# Patient Record
Sex: Male | Born: 1960 | Race: White | Hispanic: No | Marital: Single | Smoking: Current every day smoker
Health system: Southern US, Community
[De-identification: ages and names within clinical notes are randomized; demographics above are authoritative.]

## PROBLEM LIST (undated history)

## (undated) DIAGNOSIS — F200 Paranoid schizophrenia: Secondary | ICD-10-CM

## (undated) DIAGNOSIS — F209 Schizophrenia, unspecified: Secondary | ICD-10-CM

---

## 2006-08-23 ENCOUNTER — Ambulatory Visit: Payer: Self-pay | Admitting: Internal Medicine

## 2006-09-03 ENCOUNTER — Ambulatory Visit: Payer: Self-pay | Admitting: Internal Medicine

## 2006-09-05 ENCOUNTER — Ambulatory Visit (HOSPITAL_COMMUNITY): Admission: RE | Admit: 2006-09-05 | Discharge: 2006-09-05 | Payer: Self-pay | Admitting: Internal Medicine

## 2007-10-30 ENCOUNTER — Ambulatory Visit: Payer: Self-pay | Admitting: *Deleted

## 2007-10-30 ENCOUNTER — Emergency Department (HOSPITAL_COMMUNITY): Admission: EM | Admit: 2007-10-30 | Discharge: 2007-10-31 | Payer: Self-pay | Admitting: Emergency Medicine

## 2007-10-31 ENCOUNTER — Inpatient Hospital Stay (HOSPITAL_COMMUNITY): Admission: RE | Admit: 2007-10-31 | Discharge: 2007-11-07 | Payer: Self-pay | Admitting: *Deleted

## 2008-04-10 ENCOUNTER — Ambulatory Visit: Payer: Self-pay | Admitting: Psychiatry

## 2008-04-10 ENCOUNTER — Emergency Department (HOSPITAL_COMMUNITY): Admission: EM | Admit: 2008-04-10 | Discharge: 2008-04-11 | Payer: Self-pay | Admitting: Emergency Medicine

## 2008-04-11 ENCOUNTER — Inpatient Hospital Stay (HOSPITAL_COMMUNITY): Admission: EM | Admit: 2008-04-11 | Discharge: 2008-04-16 | Payer: Self-pay | Admitting: Psychiatry
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
## 2010-09-19 NOTE — H&P (Signed)
[name] [name], NUZUM NO.:  [id number]   MEDICAL RECORD NO.:  [id number]          PATIENT TYPE:  IPS   LOCATION:  0603                          FACILITY:  BH   PHYSICIAN:  Jasmine Pang, M.D. DATE OF BIRTH:  Jan 03, 1961   DATE OF ADMISSION:  10/31/2007  DATE OF DISCHARGE:                       PSYCHIATRIC ADMISSION ASSESSMENT   This is a voluntary admission to the services of Dr. Milford Cage.   This is a 50 year old divorced white male.  Apparently he fainted on the  street.  A bystander called 911, and he was brought to the emergency  department at [facility].  This was at around 2:30 on June 25.  He  reported that he had been walking since about 8:00 in the morning.  He  had not had any food or fluids since the before, and he was noted to  have a low blood sugar of 69.  His UDS was positive for benzos and  marijuana, and he reported that he was suicidal and hence he was  referred here to the [facility].  He states that his  girlfriend recently broke up with him.  He is having increased  depression due to this breakup.  He also admitted to being suicidal with  a plan to walk out into traffic.  He has been unemployed since August  2008, and he also reported homicidal ideation toward anyone who tries to  stop him from hurting himself.  He reported that he had tried to hurt  himself in the past, most recently by cutting.  This was several years  ago.   PAST PSYCHIATRIC HISTORY:  He reports one prior stay in rehab in 2006.  This was in [place], [place], [place], and this was rehab for cocaine  use.   SOCIAL HISTORY:  He went through the 12th grade.  He has been married  and divorced once.  He has 3 children, a daughter 68, a son 3, a  daughter 28.  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  He is now homeless and he has no income.   FAMILY HISTORY:  His father was an alcoholic.   ALCOHOL AND DRUG HISTORY:  He  does acknowledge smoking marijuana every  now and then.  He has no primary care Suzi Hernan.  He has no psychiatric  care.   MEDICAL PROBLEMS:  None are known.   MEDICATIONS:  None are prescribed.   DRUG ALLERGIES:  No known drug allergies.   As already stated, his UDS was positive for marijuana and benzos.  He  had a low blood sugar.  He had no other remarkable physical findings.   His vital signs on admission to our unit showed he is 65-1/2 inches  tall, weighs 146, temperature 97, blood pressure 119/78 to 114/74, pulse  is 54 to 58 and respirations are 20.  He has several tattoos.  Please  see anatomic drawing for placement.  This was his first syncopal  episode.   MENTAL STATUS EXAM:  Today he is alert and oriented.  He is casually  groomed and dressed.  He appears to be rehydrated and adequately  nourished at this point in time.  His speech has a normal rate, rhythm  and tone although he answers briefly and is not very elaborative in his  answers.  His mood is depressed.  It is appropriate to the situation.  His affect is normal range.  His thought processes are clear, rational  and goal oriented.  He would like to find a job and get back on track.  Judgment and insight are fair.  Concentration and memory are intact.  He  still reports that he is suicidal although he has no active plan.  He is  no longer homicidal and he has no auditory or visual hallucinations.   AXIS I:  Bipolar disorder NOS by his report.  He has an adjustment  disorder due to recent breakup with girlfriend.  AXIS II:  History for abuse in childhood.  AXIS III:  Recent syncope due to lack of food and water.  AXIS IV:  Severe.  He is now homeless.  AXIS V:  35.   The plan is to admit for safety and stabilization.  He was started on  some Depakote for mood stabilization at hour of sleep, Depakote ER 500  mg at h.s. by Dr. Katrinka Blazing, and trazodone 50 mg at h.s., may repeat x1.   The case manager will help with  post discharge plans, including getting  him hooked up with the [facility] for medication  continuation and potentially going back to the shelter.  He has been to  the shelter here in [place] before and is familiar with it.   Estimated length of stay is 3 to 5 days.      Mickie Leonarda Salon, P.A.-C.      Jasmine Pang, M.D.  Electronically Signed    MD/MEDQ  D:  11/01/2007  T:  11/01/2007  Job:  [id number]

## 2010-09-19 NOTE — Discharge Summary (Signed)
[name] [name], [name] [name] NO.:  [id number]   MEDICAL RECORD NO.:  [id number]          PATIENT TYPE:  IPS   LOCATION:  0303                          FACILITY:  BH   PHYSICIAN:  Anselm Jungling, MD  DATE OF BIRTH:  1961/03/15   DATE OF ADMISSION:  04/11/2008  DATE OF DISCHARGE:  04/16/2008                               DISCHARGE SUMMARY   IDENTIFYING DATA/REASON FOR ADMISSION:  This was an inpatient  psychiatric admission for [name] [name], a 50 year old single white male who  was admitted on an involuntary basis after presenting with thoughts of  killing himself or possibly hurting his girlfriend.  He came to [place] with  a history of a psychiatric illness and had been a patient of [name]  at [facility].  However, he had been noncompliant  with his medications for some time.  Please refer to the admission note  for further details pertaining to the symptoms, circumstances and  history that led to his hospitalization.  He was given an initial Axis I  diagnosis of psychosis NOS.  He also had a history of substance abuse  and reported PTSD from early childhood trauma.   MEDICAL/LABORATORY:  The patient was described as having a seizure  disorder at the time of admission, but he was medically cleared in the  emergency department.  His UDS was positive for barbiturates and  marijuana.  He had a slightly elevated white count of 12,100, but this  did not correlate to any apparent illness or infection.  He was  medically and physically assessed by the psychiatric nurse practitioner.  There were no significant medical issues.   HOSPITAL COURSE:  The patient was admitted to the adult inpatient  psychiatric service.  He presented as a disheveled, but adequately  nourished and normally developed adult male who appeared cognitively  quite clouded, depressed and possibly confused.  However, he was  cooperative with treatment and acknowledged substance abuse  issues as  well.  He was agreeable to restarting medication.  He had previously  been treated with a regimen of Seroquel and Depakote.  During this  hospital stay, he was treated with Zyprexa and Depakote with trazodone  as needed for sleep.   The patient participated in various therapeutic activities and groups.  He was a reasonably good participant in the treatment program.  He was  not able to identify any supports for purposes of family conferencing.  He had been living with a girlfriend, but had been kicked out of her  home after coming into with conflict with one of her relatives that had  come to stay there.  As such, he was essentially homeless.   The patient's mood and affect improved, and his thinking cleared over  the next several days.  He worked closely with the case manager towards  an aftercare plan.  He indicated that he wanted to get out of the  [place] area and he was agreeable to a plan to go stay at the shelter  in [place].  He was also open to substance abuse treatment and he  was ultimately referred to a shelter there that had the capability of  providing that service at the same time that he was living there.   AFTERCARE:  As above.  The patient was referred to [facility]  in [place].   DISCHARGE MEDICATIONS:  1. Zyprexa 10 mg q.h.s.  2. Depakote 500 mg q.h.s.  3. Trazodone 100 mg q.h.s.   DISCHARGE INSTRUCTIONS:  He was referred to the [facility] on  [facility] in [place].  He was directed to ask for Sandrea Hughs, who runs the substance abuse program there.  That program is  known as [facility].  He was also referred to the [facility] in  [place].   DISCHARGE DIAGNOSES:  AXIS I:  Polysubstance abuse, not otherwise  specified, early remission.  Schizoaffective disorder, not otherwise  specified.  AXIS II:  Deferred.  AXIS III:  History of seizure disorder.  AXIS IV:  Stressors severe.  AXIS  V:  Global assessment of functioning on discharge 55.      Anselm Jungling, MD  Electronically Signed     SPB/MEDQ  D:  04/16/2008  T:  04/16/2008  Job:  [id number]

## 2010-09-22 NOTE — Discharge Summary (Signed)
NAMEADYEN, [name] NO.:  [id number]   MEDICAL RECORD NO.:  [id number]          PATIENT TYPE:  IPS   LOCATION:  0603                          FACILITY:  BH   PHYSICIAN:  Jasmine Pang, M.D. DATE OF BIRTH:  1960/05/21   DATE OF ADMISSION:  10/31/2007  DATE OF DISCHARGE:  11/07/2007                               DISCHARGE SUMMARY   IDENTIFICATION:  This is a 50 year old divorced white male who was  admitted on a voluntary basis on October 31, 2007.   HISTORY OF PRESENT ILLNESS:  The patient apparently fainted on the  street, a bystander called 911 and he was brought to the emergency  department at [name].  This was around 2:30 on October 30, 2007.  He  reported that he had been walking since about 8 o'clock in the morning.  He had not had any food or fluid since the night before and he was noted  to have a low blood sugar of 69.  His UDS was positive for benzos and  marijuana.  He reported that he was suicidal and hence, he was referred  to the [name].  He states that his girlfriend recently  broke up with him.  He is having increased depression due to the  breakup.  He also admitted to being suicidal with a plan to walk into  traffic.  He has been unemployed since August 2008.  He reported that he  had tried to hurt himself in the past, most recently by cutting.  This  was several years ago.  AM Depakote level on November 03, 2007, was 36.5.  The CBC was within normal limits, and hepatic profile was within normal  limits.   PAST PSYCHIATRIC HISTORY:  The patient reports one prior stay in rehab  in 2006.  [place] [name] [name] [name] and this rehab was  for cocaine use.   FAMILY HISTORY:  The patient's father was alcoholic.   DRUG AND ALCOHOL HISTORY:  The patient does acknowledge smoking  marijuana every now and then.  He has no primary care [name].  He has  no psychiatric care.   MEDICATIONS:  None are prescribed.   MEDICAL  PROBLEMS:  None.   DRUG ALLERGIES:  No known drug allergies.   PHYSICAL EXAM:  There were no acute physical or medical problems noted  on exam.   ADMISSION LABORATORIES:  As already stated, his UDS was positive for  marijuana and benzodiazepines.  His blood sugar was low.  His blood  glucose was low at 69.  He had no other remarkable physical findings.   [name] COURSE:  Upon admission, the patient was started on trazodone  50 mg p.o. q.h.s. 1-2 pills at bedtime if needed.  He was also started  on Depakote ER 500 mg p.o. q.h.s. for mood stabilization.  In individual  sessions with me, the patient was reserved, but cooperative.  He  participated appropriately in unit therapeutic groups and activities.  He discussed the argument with his girlfriend, then he left and tried to  walk to [name] (from  [name]).  He has multiple stressors  including being unemployed and past history of abuse.  He has been in  treatment in the past on lithium carbonate and Prozac.  He is currently  homeless.  He states he stopped drinking 2 years ago.  He has a history  of cocaine abuse in 2005.  He states he gets easily irritated, flies off  the handle and gets rageful.  In addition to the Depakote 500 mg p.o.  q.h.s. on November 02, 2007, he was started on Celexa 20 mg q.a.m.  Trazodone was discontinued because he was not helping with his sleep and  stated he was started on Ambien 10 mg 1-2 p.o. q. h.s.  As  hospitalization progressed, he continued to be somewhat depressed and  anxious with suicidal ideation though he could contract for safety at  the [name], he states he wants to stay in [name] (cannot go back  to [name]).  On November 02, 2007, the patient continued to have  some suicidal ideation.  His sleep was poor with middle of the night  awakening and early morning awakening.  On November 03, 2007, the patient  stated he was thinking of things that happened in his life.  He has been   thinking of the breakup with his girlfriend.  He is worried about where  he will go when he leaves I have been homeless before.  He continued  to be depressed and anxious.  On November 05, 2007, the patient was less  depressed, less anxious with no suicidal ideation.  He was anticipating  discharge on November 07, 2007.  He planned to return to the [name].  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  He was going to go to the [name] for follow up.  His  sleep was good.  He stated he was scared of discharge, but plans to take  it one day at a time.  He discussed alienation from his children and  siblings and his regrets about this.  On November 07, 2007, mental status had  improved markedly from admission status.  The patient was less  depressed, less anxious.  There was no suicidal or homicidal ideation.  No thoughts of self-injurious behavior.  No auditory or visual  hallucinations.  No paranoia or delusions.  Thoughts were logical and  goal-directed.  Thought content, no predominant theme.  Cognitive was  grossly intact.  It was felt the patient was safe for discharge and will  follow up at the [name].   DISCHARGE DIAGNOSES:  Axis I:  Bipolar disorder, not otherwise specified  by his report. Features of posttraumatic stress disorder chronic (from  abuse in childhood).  Axis III:  Recent syncope due to lack of food and water.  Axis IV:  Severe (the patient just broke up with his girlfriend, the  patient is now homeless.  He has financial problems.  He has a burden of  his psychiatric illness).  Axis V: Global assessment of functioning is 50 upon discharge.  GAF was  35 upon admission.  GAF highest past year was 65.   DISCHARGE PLAN:  There was no specific activity level or dietary  restrictions.  The patient was going to return to the [name] to  live.  He was wanting to go to [name] on July 6th at 1  p.m.   DISCHARGE MEDICATIONS:  1. Depakote ER  500 mg at bedtime.  2. Celexa 20 mg daily.  3. Ambien 10 mg at bedtime as needed for sleep.  4. Seroquel 25 mg one every 6 hours if needed for anxiety.      Jasmine Pang, M.D.  Electronically Signed     BHS/MEDQ  D:  11/17/2007  T:  11/18/2007  Job:  [id number]

## 2011-02-01 LAB — VALPROIC ACID LEVEL: Valproic Acid Lvl: 36.5 — ABNORMAL LOW

## 2011-02-01 LAB — HEPATIC FUNCTION PANEL
ALT: 19
Albumin: 3.8
Indirect Bilirubin: 0.6
Total Protein: 6.9

## 2011-02-01 LAB — URINALYSIS, ROUTINE W REFLEX MICROSCOPIC
Bilirubin Urine: NEGATIVE
Glucose, UA: NEGATIVE
Hgb urine dipstick: NEGATIVE
Ketones, ur: >80 — AB
Protein, ur: NEGATIVE

## 2011-02-01 LAB — POCT I-STAT, CHEM 8
Hemoglobin: 15.3
Potassium: 4.8
Sodium: 139
TCO2: 23

## 2011-02-01 LAB — POCT CARDIAC MARKERS
CKMB, poc: <1 — ABNORMAL LOW
Myoglobin, poc: 87
Operator id: 146091

## 2011-02-01 LAB — RAPID URINE DRUG SCREEN, HOSP PERFORMED
Amphetamines: NOT DETECTED
Tetrahydrocannabinol: POSITIVE — AB

## 2011-02-01 LAB — CBC
MCHC: 34.2
RBC: 4.64
WBC: 7.2

## 2011-02-09 LAB — URINALYSIS, ROUTINE W REFLEX MICROSCOPIC
Hgb urine dipstick: NEGATIVE
Nitrite: NEGATIVE
Specific Gravity, Urine: 1.01 (ref 1.005–1.030)
Urobilinogen, UA: 0.2 mg/dL (ref 0.0–1.0)

## 2011-02-09 LAB — CBC
HCT: 47.1 % (ref 39.0–52.0)
Hemoglobin: 16.1 g/dL (ref 13.0–17.0)
MCHC: 34.3 g/dL (ref 30.0–36.0)
MCV: 95.3 fL (ref 78.0–100.0)
RDW: 13.3 % (ref 11.5–15.5)

## 2011-02-09 LAB — DIFFERENTIAL
Basophils Absolute: 0 10*3/uL (ref 0.0–0.1)
Basophils Relative: 0 % (ref 0–1)
Eosinophils Relative: 1 % (ref 0–5)
Monocytes Absolute: 0.5 10*3/uL (ref 0.1–1.0)

## 2011-02-09 LAB — RAPID URINE DRUG SCREEN, HOSP PERFORMED
Cocaine: NOT DETECTED
Opiates: NOT DETECTED
Tetrahydrocannabinol: POSITIVE — AB

## 2011-02-09 LAB — BASIC METABOLIC PANEL
BUN: 10 mg/dL (ref 6–23)
Chloride: 102 mEq/L (ref 96–112)
Glucose, Bld: 95 mg/dL (ref 70–99)
Potassium: 4 mEq/L (ref 3.5–5.1)

## 2013-05-21 ENCOUNTER — Emergency Department (HOSPITAL_COMMUNITY)
Admission: EM | Admit: 2013-05-21 | Discharge: 2013-05-24 | Disposition: A | Discharge status: Other Acute Inpatient Hospital | Payer: Self-pay | Attending: Emergency Medicine | Admitting: Emergency Medicine

## 2013-05-21 ENCOUNTER — Encounter (HOSPITAL_COMMUNITY): Payer: Self-pay | Admitting: Emergency Medicine

## 2013-05-21 DIAGNOSIS — F17200 Nicotine dependence, unspecified, uncomplicated: Secondary | ICD-10-CM | POA: Insufficient documentation

## 2013-05-21 DIAGNOSIS — F411 Generalized anxiety disorder: Secondary | ICD-10-CM | POA: Insufficient documentation

## 2013-05-21 DIAGNOSIS — IMO0002 Reserved for concepts with insufficient information to code with codable children: Secondary | ICD-10-CM | POA: Insufficient documentation

## 2013-05-21 DIAGNOSIS — Z5900 Homelessness unspecified: Secondary | ICD-10-CM | POA: Insufficient documentation

## 2013-05-21 DIAGNOSIS — F200 Paranoid schizophrenia: Secondary | ICD-10-CM

## 2013-05-21 DIAGNOSIS — R45851 Suicidal ideations: Secondary | ICD-10-CM | POA: Insufficient documentation

## 2013-05-21 DIAGNOSIS — R443 Hallucinations, unspecified: Secondary | ICD-10-CM | POA: Insufficient documentation

## 2013-05-21 HISTORY — DX: Paranoid schizophrenia: F20.0

## 2013-05-21 LAB — RAPID URINE DRUG SCREEN, HOSP PERFORMED
AMPHETAMINES: NOT DETECTED
BARBITURATES: NOT DETECTED
BENZODIAZEPINES: NOT DETECTED
Cocaine: NOT DETECTED
Opiates: NOT DETECTED
Tetrahydrocannabinol: POSITIVE — AB

## 2013-05-21 LAB — CBC
HEMATOCRIT: 42.3 % (ref 39.0–52.0)
HEMOGLOBIN: 15.4 g/dL (ref 13.0–17.0)
MCH: 33.6 pg (ref 26.0–34.0)
MCHC: 36.4 g/dL — ABNORMAL HIGH (ref 30.0–36.0)
MCV: 92.2 fL (ref 78.0–100.0)
PLATELETS: 366 10*3/uL (ref 150–400)
RBC: 4.59 MIL/uL (ref 4.22–5.81)
RDW: 13.1 % (ref 11.5–15.5)
WBC: 16.8 10*3/uL — AB (ref 4.0–10.5)

## 2013-05-21 LAB — COMPREHENSIVE METABOLIC PANEL
ALT: 36 U/L (ref 0–53)
AST: 23 U/L (ref 0–37)
Albumin: 4.4 g/dL (ref 3.5–5.2)
Alkaline Phosphatase: 134 U/L — ABNORMAL HIGH (ref 39–117)
BILIRUBIN TOTAL: 0.4 mg/dL (ref 0.3–1.2)
BUN: 16 mg/dL (ref 6–23)
CHLORIDE: 100 meq/L (ref 96–112)
CO2: 20 meq/L (ref 19–32)
CREATININE: 0.85 mg/dL (ref 0.50–1.35)
Calcium: 9.3 mg/dL (ref 8.4–10.5)
GLUCOSE: 87 mg/dL (ref 70–99)
Potassium: 3.8 mEq/L (ref 3.7–5.3)
Sodium: 139 mEq/L (ref 137–147)
Total Protein: 8.2 g/dL (ref 6.0–8.3)

## 2013-05-21 LAB — GLUCOSE, CAPILLARY: Glucose-Capillary: 101 mg/dL — ABNORMAL HIGH (ref 70–99)

## 2013-05-21 LAB — ETHANOL: Alcohol, Ethyl (B): <11 mg/dL (ref 0–11)

## 2013-05-21 LAB — ACETAMINOPHEN LEVEL

## 2013-05-21 LAB — SALICYLATE LEVEL: Salicylate Lvl: <2 mg/dL — ABNORMAL LOW (ref 2.8–20.0)

## 2013-05-21 MED ORDER — LORAZEPAM 1 MG PO TABS
2.0000 mg | ORAL_TABLET | Freq: Once | ORAL | Status: AC
  Administered 2013-05-21: 2 mg via ORAL
  Filled 2013-05-21: qty 2

## 2013-05-21 MED ORDER — NICOTINE 21 MG/24HR TD PT24
21.0000 mg | MEDICATED_PATCH | Freq: Every day | TRANSDERMAL | Status: DC
  Administered 2013-05-22 – 2013-05-23 (×3): 21 mg via TRANSDERMAL
  Filled 2013-05-21 (×3): qty 1

## 2013-05-21 MED ORDER — ONDANSETRON HCL 4 MG PO TABS
4.0000 mg | ORAL_TABLET | Freq: Three times a day (TID) | ORAL | Status: DC | PRN
  Administered 2013-05-21: 4 mg via ORAL
  Filled 2013-05-21: qty 1

## 2013-05-21 MED ORDER — ZOLPIDEM TARTRATE 5 MG PO TABS
5.0000 mg | ORAL_TABLET | Freq: Every evening | ORAL | Status: DC | PRN
  Administered 2013-05-21 – 2013-05-22 (×2): 5 mg via ORAL
  Filled 2013-05-21 (×2): qty 1

## 2013-05-21 MED ORDER — LORAZEPAM 1 MG PO TABS
1.0000 mg | ORAL_TABLET | Freq: Four times a day (QID) | ORAL | Status: DC | PRN
  Administered 2013-05-22 – 2013-05-23 (×4): 1 mg via ORAL
  Filled 2013-05-21 (×4): qty 1

## 2013-05-21 NOTE — ED Notes (Signed)
Called for patient and no response

## 2013-05-21 NOTE — ED Notes (Signed)
Pt states homeless, living on street x 7 days

## 2013-05-21 NOTE — ED Notes (Signed)
Pt states he has been feeling SI last 2 weeks. Hearing voices that tell him to mutilate himself. Pt plans to cut his own throat. CP x 4-5 days, with Nausea, diaphoresis. Lasted 45 minutes Currently CP resolved. Last had CP pain this morning around 9:30 am while make breakfast.

## 2013-05-21 NOTE — ED Notes (Signed)
Personal belongings bagged/labelled stored at locker no. 1/2/3 .

## 2013-05-21 NOTE — ED Notes (Signed)
PT placed in blue paper scrubs and wand ed by security. House coverage notified of need for sitter

## 2013-05-21 NOTE — ED Provider Notes (Addendum)
CSN: [id number]     Arrival date & time 05/21/13  1952 History   None    Chief Complaint  Patient presents with  . Suicidal   (Consider location/radiation/quality/duration/timing/severity/associated sxs/prior Treatment) HPI Comments: Patient states, that he is been hearing voices for the past several years, worse in the last 2, weeks the voices have become more insistent, telling him to hurt himself.  He denies any drug or alcohol abuse.  He has not taken any medication for his psychiatric illness in several years. Within the past, week.  He has become homeless per his choice At this time.  He is very agitated, pacing in the room, but cooperative  The history is provided by the patient.    Past Medical History  Diagnosis Date  . Paranoid schizophrenia    History reviewed. No pertinent past surgical history. History reviewed. No pertinent family history. History  Substance Use Topics  . Smoking status: Current Every Day Smoker -- 1.00 packs/day  . Smokeless tobacco: Never Used  . Alcohol Use: No    Review of Systems  Constitutional: Negative for fever.  Respiratory: Negative for shortness of breath.   Cardiovascular: Negative for chest pain.  Neurological: Negative for headaches.  Psychiatric/Behavioral: Positive for suicidal ideas, hallucinations and self-injury.  All other systems reviewed and are negative.    Allergies  Review of patient's allergies indicates no known allergies.  Home Medications   Current Outpatient Rx  Name  Route  Sig  Dispense  Refill  . benztropine (COGENTIN) 1 MG tablet   Oral   Take 1 tablet (1 mg total) by mouth 2 (two) times daily.   60 tablet   0   . divalproex (DEPAKOTE ER) 250 MG 24 hr tablet   Oral   Take 3 tablets (750 mg total) by mouth at bedtime.   180 tablet   0   . haloperidol (HALDOL) 10 MG tablet   Oral   Take 1 tablet (10 mg total) by mouth 3 (three) times daily. For voices.   90 tablet   0   . traZODone (DESYREL)  100 MG tablet   Oral   Take 2 tablets (200 mg total) by mouth at bedtime.   60 tablet   0    BP 136/88  Pulse 88  Temp(Src) 97.4 F (36.3 C) (Oral)  Resp 18  SpO2 96% Physical Exam  Nursing note and vitals reviewed. Constitutional: He appears well-developed and well-nourished. He appears distressed.  Eyes: Pupils are equal, round, and reactive to light.  Neck: Normal range of motion.  Cardiovascular: Normal rate and regular rhythm.   Pulmonary/Chest: Effort normal and breath sounds normal.  Neurological: He is alert.  Skin:  Is one small lesion on his right forearm, and medial aspect, approximately 1/2 cm round, with a white fluctuant center, minimally painful.  Started today  Psychiatric: His mood appears anxious. He is agitated and actively hallucinating. He expresses impulsivity. He expresses suicidal ideation. He expresses suicidal plans.    ED Course  Procedures (including critical care time) Labs Review Labs Reviewed  CBC - Abnormal; Notable for the following:    WBC 16.8 (*)    MCHC 36.4 (*)    All other components within normal limits  COMPREHENSIVE METABOLIC PANEL - Abnormal; Notable for the following:    Alkaline Phosphatase 134 (*)    All other components within normal limits  SALICYLATE LEVEL - Abnormal; Notable for the following:    Salicylate Lvl <2.0 (*)  All other components within normal limits  URINE RAPID DRUG SCREEN (HOSP PERFORMED) - Abnormal; Notable for the following:    Tetrahydrocannabinol POSITIVE (*)    All other components within normal limits  GLUCOSE, CAPILLARY - Abnormal; Notable for the following:    Glucose-Capillary 101 (*)    All other components within normal limits  ETHANOL  ACETAMINOPHEN LEVEL   Imaging Review No results found.  EKG Interpretation   None       MDM   1. Paranoid schizophrenia, chronic condition     Patient has been evaluated.  Labs have been ordered.  I've asked that the patient be given 2 mg of  Ativan for his agitation.  I will move him to see for psychiatric evaluation    Arman Filter, NP 05/24/13 1955  Arman Filter, NP 06/07/13 [phone]

## 2013-05-22 MED ORDER — PERMETHRIN 0.25 % LIQD
Freq: Once | Status: DC
  Filled 2013-05-22: qty 147.86

## 2013-05-22 MED ORDER — PERMETHRIN 1 % EX LOTN
TOPICAL_LOTION | Freq: Once | CUTANEOUS | Status: AC
  Administered 2013-05-22: 22:00:00 via TOPICAL
  Filled 2013-05-22: qty 59

## 2013-05-22 NOTE — ED Notes (Signed)
Patient has poor eye contact during psych assessment. He holds his head in his hands. He states he has been suicidal for years. States he used to be on medications for the voices but he has been off since 2009 or so. States the medications made the voices worse.

## 2013-05-22 NOTE — Progress Notes (Signed)
MHT initiated inpatient treatment referrals by faxing the following hospitals with bed availability: 1)[facility] 2)[facility] 3)[facility] 4)[facility] [place] 5)[place] 6)[facility] 7)[facility] 10)[facility]  Blain PaisMichelle L Cayleb Jarnigan, MHT/NS
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
## 2013-05-22 NOTE — ED Notes (Signed)
TELEPSYCH-CONFERENCE IN PROGRESS AT PT.'S BEDSIDE .

## 2013-05-23 ENCOUNTER — Encounter (HOSPITAL_COMMUNITY): Payer: Self-pay | Admitting: Emergency Medicine

## 2013-05-23 MED ORDER — DOUBLE ANTIBIOTIC 500-10000 UNIT/GM EX OINT
1.0000 "application " | TOPICAL_OINTMENT | Freq: Two times a day (BID) | CUTANEOUS | Status: DC
  Administered 2013-05-23: 1 via TOPICAL
  Filled 2013-05-23: qty 14.17

## 2013-05-23 MED ORDER — DOUBLE ANTIBIOTIC 500-10000 UNIT/GM EX OINT
1.0000 "application " | TOPICAL_OINTMENT | Freq: Two times a day (BID) | CUTANEOUS | Status: DC
  Filled 2013-05-23 (×17): qty 1

## 2013-05-23 MED ORDER — SULFAMETHOXAZOLE-TMP DS 800-160 MG PO TABS
1.0000 | ORAL_TABLET | Freq: Once | ORAL | Status: AC
  Administered 2013-05-24: 1 via ORAL
  Filled 2013-05-23: qty 1

## 2013-05-23 NOTE — ED Notes (Signed)
[facility] and is going to [id number]. Accepting physician is Maryjean Mornharles Kober.

## 2013-05-23 NOTE — ED Notes (Signed)
Pt's has a 4cm abscess on the anterior, medial portion of his right forearm.

## 2013-05-23 NOTE — ED Notes (Signed)
[place], MD at bedside.  

## 2013-05-23 NOTE — Consult Note (Signed)
  Requested to review pt for admission to 400 Unit-WBC significant and pt had lesions on arm-suspicious for MRSA clinically.Requested culture and antibiotic Rx prior [facility].

## 2013-05-23 NOTE — Progress Notes (Signed)
Danny a LPC at [facility] called to let us know that they were declining pt. due to pt's acuity.  

## 2013-05-23 NOTE — Progress Notes (Signed)
[facility] called stating [facility] added to [facility].  Blain PaisMichelle L Asia Dusenbury, MHT/NS

## 2013-05-24 ENCOUNTER — Inpatient Hospital Stay (HOSPITAL_COMMUNITY)
Admission: AD | Admit: 2013-05-24 | Discharge: 2013-06-01 | DRG: 885 | Disposition: A | Discharge status: Home / Self Care | Payer: No Typology Code available for payment source | Source: Intra-hospital | Attending: Psychiatry | Admitting: Psychiatry

## 2013-05-24 ENCOUNTER — Encounter (HOSPITAL_COMMUNITY): Payer: Self-pay | Admitting: *Deleted

## 2013-05-24 DIAGNOSIS — F319 Bipolar disorder, unspecified: Secondary | ICD-10-CM | POA: Diagnosis present

## 2013-05-24 DIAGNOSIS — F200 Paranoid schizophrenia: Principal | ICD-10-CM | POA: Diagnosis present

## 2013-05-24 DIAGNOSIS — F29 Unspecified psychosis not due to a substance or known physiological condition: Secondary | ICD-10-CM | POA: Diagnosis present

## 2013-05-24 DIAGNOSIS — F411 Generalized anxiety disorder: Secondary | ICD-10-CM | POA: Diagnosis present

## 2013-05-24 DIAGNOSIS — F322 Major depressive disorder, single episode, severe without psychotic features: Secondary | ICD-10-CM | POA: Diagnosis present

## 2013-05-24 DIAGNOSIS — F17200 Nicotine dependence, unspecified, uncomplicated: Secondary | ICD-10-CM | POA: Diagnosis present

## 2013-05-24 DIAGNOSIS — F1210 Cannabis abuse, uncomplicated: Secondary | ICD-10-CM | POA: Diagnosis present

## 2013-05-24 DIAGNOSIS — Z23 Encounter for immunization: Secondary | ICD-10-CM

## 2013-05-24 DIAGNOSIS — B850 Pediculosis due to Pediculus humanus capitis: Secondary | ICD-10-CM | POA: Diagnosis present

## 2013-05-24 DIAGNOSIS — R45851 Suicidal ideations: Secondary | ICD-10-CM

## 2013-05-24 DIAGNOSIS — F4310 Post-traumatic stress disorder, unspecified: Secondary | ICD-10-CM | POA: Diagnosis present

## 2013-05-24 DIAGNOSIS — B356 Tinea cruris: Secondary | ICD-10-CM | POA: Diagnosis present

## 2013-05-24 DIAGNOSIS — F1220 Cannabis dependence, uncomplicated: Secondary | ICD-10-CM

## 2013-05-24 DIAGNOSIS — R45850 Homicidal ideations: Secondary | ICD-10-CM

## 2013-05-24 MED ORDER — ACETAMINOPHEN 325 MG PO TABS: 650.0000 mg | ORAL_TABLET | Freq: Four times a day (QID) | ORAL | Status: DC | PRN

## 2013-05-24 MED ORDER — MUPIROCIN 2 % EX OINT
TOPICAL_OINTMENT | Freq: Three times a day (TID) | CUTANEOUS | Status: DC
  Filled 2013-05-24 (×2): qty 22

## 2013-05-24 MED ORDER — ZIPRASIDONE MESYLATE 20 MG IM SOLR
20.0000 mg | Freq: Once | INTRAMUSCULAR | Status: AC
  Administered 2013-05-24: 20 mg via INTRAMUSCULAR
  Filled 2013-05-24: qty 20

## 2013-05-24 MED ORDER — MAGNESIUM HYDROXIDE 400 MG/5ML PO SUSP: 30.0000 mL | Freq: Every day | ORAL | Status: DC | PRN

## 2013-05-24 MED ORDER — INFLUENZA VAC SPLIT QUAD 0.5 ML IM SUSP
0.5000 mL | INTRAMUSCULAR | Status: AC
  Administered 2013-05-27: 0.5 mL via INTRAMUSCULAR
  Filled 2013-05-24: qty 0.5

## 2013-05-24 MED ORDER — OLANZAPINE 10 MG PO TBDP
10.0000 mg | ORAL_TABLET | Freq: Every day | ORAL | Status: DC
  Administered 2013-05-24 – 2013-05-25 (×2): 10 mg via ORAL
  Filled 2013-05-24 (×3): qty 1

## 2013-05-24 MED ORDER — PNEUMOCOCCAL VAC POLYVALENT 25 MCG/0.5ML IJ INJ
0.5000 mL | INJECTION | INTRAMUSCULAR | Status: AC
  Administered 2013-05-27: 0.5 mL via INTRAMUSCULAR

## 2013-05-24 MED ORDER — ZIPRASIDONE MESYLATE 20 MG IM SOLR
INTRAMUSCULAR | Status: AC
  Administered 2013-05-24: 20 mg via INTRAMUSCULAR
  Filled 2013-05-24: qty 20

## 2013-05-24 MED ORDER — SULFAMETHOXAZOLE-TMP DS 800-160 MG PO TABS
1.0000 | ORAL_TABLET | Freq: Two times a day (BID) | ORAL | Status: DC
  Administered 2013-05-24 – 2013-05-27 (×7): 1 via ORAL
  Filled 2013-05-24 (×11): qty 1

## 2013-05-24 MED ORDER — TRAZODONE HCL 50 MG PO TABS
50.0000 mg | ORAL_TABLET | Freq: Every evening | ORAL | Status: DC | PRN
  Administered 2013-05-24: 50 mg via ORAL
  Filled 2013-05-24: qty 1

## 2013-05-24 MED ORDER — ONDANSETRON HCL 4 MG PO TABS: 4.0000 mg | ORAL_TABLET | Freq: Three times a day (TID) | ORAL | Status: DC | PRN

## 2013-05-24 MED ORDER — LINDANE 1 % EX SHAM
MEDICATED_SHAMPOO | Freq: Once | CUTANEOUS | Status: AC
  Administered 2013-05-24: 17:00:00 via TOPICAL
  Filled 2013-05-24: qty 60

## 2013-05-24 MED ORDER — NICOTINE 21 MG/24HR TD PT24
21.0000 mg | MEDICATED_PATCH | Freq: Every day | TRANSDERMAL | Status: DC
  Filled 2013-05-24: qty 1

## 2013-05-24 MED ORDER — TERBINAFINE HCL 1 % EX CREA
TOPICAL_CREAM | Freq: Two times a day (BID) | CUTANEOUS | Status: DC
  Administered 2013-05-24 – 2013-06-01 (×10): via TOPICAL
  Filled 2013-05-24: qty 12

## 2013-05-24 MED ORDER — NICOTINE 21 MG/24HR TD PT24
21.0000 mg | MEDICATED_PATCH | Freq: Every day | TRANSDERMAL | Status: DC
  Administered 2013-05-25 – 2013-05-29 (×5): 21 mg via TRANSDERMAL
  Filled 2013-05-24 (×12): qty 1

## 2013-05-24 MED ORDER — LORAZEPAM 2 MG/ML IJ SOLN
2.0000 mg | Freq: Once | INTRAMUSCULAR | Status: AC
  Administered 2013-05-24: 2 mg via INTRAMUSCULAR

## 2013-05-24 MED ORDER — DIVALPROEX SODIUM ER 500 MG PO TB24
500.0000 mg | ORAL_TABLET | Freq: Every day | ORAL | Status: DC
  Administered 2013-05-24 – 2013-05-27 (×4): 500 mg via ORAL
  Filled 2013-05-24 (×5): qty 1

## 2013-05-24 MED ORDER — LORAZEPAM 2 MG/ML IJ SOLN
2.0000 mg | Freq: Four times a day (QID) | INTRAMUSCULAR | Status: DC | PRN
  Administered 2013-05-24 – 2013-05-25 (×2): 2 mg via INTRAMUSCULAR
  Filled 2013-05-24 (×2): qty 1

## 2013-05-24 MED ORDER — ZIPRASIDONE MESYLATE 20 MG IM SOLR
20.0000 mg | Freq: Two times a day (BID) | INTRAMUSCULAR | Status: DC | PRN
  Administered 2013-05-24: 20 mg via INTRAMUSCULAR
  Filled 2013-05-24: qty 20

## 2013-05-24 MED ORDER — LORAZEPAM 2 MG/ML IJ SOLN
INTRAMUSCULAR | Status: AC
  Administered 2013-05-24: 2 mg via INTRAMUSCULAR
  Filled 2013-05-24: qty 1

## 2013-05-24 MED ORDER — DOUBLE ANTIBIOTIC 500-10000 UNIT/GM EX OINT
1.0000 "application " | TOPICAL_OINTMENT | Freq: Two times a day (BID) | CUTANEOUS | Status: DC
  Filled 2013-05-24 (×18): qty 1

## 2013-05-24 MED ORDER — TRAZODONE HCL 50 MG PO TABS
50.0000 mg | ORAL_TABLET | Freq: Every evening | ORAL | Status: DC | PRN
  Administered 2013-05-24 – 2013-05-27 (×4): 50 mg via ORAL
  Filled 2013-05-24 (×4): qty 1

## 2013-05-24 MED ORDER — MUPIROCIN 2 % EX OINT
TOPICAL_OINTMENT | Freq: Three times a day (TID) | CUTANEOUS | Status: AC
  Administered 2013-05-24 – 2013-05-26 (×6): via TOPICAL
  Filled 2013-05-24: qty 22

## 2013-05-24 MED ORDER — ALUM & MAG HYDROXIDE-SIMETH 200-200-20 MG/5ML PO SUSP: 30.0000 mL | ORAL | Status: DC | PRN

## 2013-05-24 MED ORDER — PERMETHRIN 0.25 % LIQD
Freq: Once | Status: AC
  Administered 2013-05-24: 23:00:00 via TOPICAL
  Filled 2013-05-24: qty 147.86

## 2013-05-24 MED ORDER — MAGNESIUM HYDROXIDE 400 MG/5ML PO SUSP
30.0000 mL | Freq: Every day | ORAL | Status: DC | PRN
  Administered 2013-05-28: 30 mL via ORAL

## 2013-05-24 MED ORDER — ALUM & MAG HYDROXIDE-SIMETH 200-200-20 MG/5ML PO SUSP
30.0000 mL | ORAL | Status: DC | PRN
  Administered 2013-05-29: 30 mL via ORAL

## 2013-05-24 NOTE — ED Notes (Signed)
[facility]

## 2013-05-24 NOTE — [facility] Group Notes (Signed)
[facility] Group Notes: (Clinical Social Work)   05/24/2013      Type of Therapy:  Group Therapy   Participation Level:  Did Not Attend    Tenoch Mcclure Grossman-Orr, LCSW 05/24/2013, 12:51 PM     

## 2013-05-24 NOTE — Tx Team (Signed)
Initial Interdisciplinary Treatment Plan  PATIENT STRENGTHS: (choose at least two) Average or above average intelligence Communication skills General fund of knowledge Motivation for treatment/growth Physical Health  PATIENT STRESSORS: Financial difficulties Loss of housing Medication change or noncompliance No support   PROBLEM LIST: Problem List/Patient Goals Date to be addressed Date deferred Reason deferred Estimated date of resolution  Command AH 05/24/13     SI 05/24/13     Potential for aggression 05/24/13                                          DISCHARGE CRITERIA:  Adequate post-discharge living arrangements Improved stabilization in mood, thinking, and/or behavior Need for constant or close observation no longer present Reduction of life-threatening or endangering symptoms to within safe limits Verbal commitment to aftercare and medication compliance  PRELIMINARY DISCHARGE PLAN: Outpatient therapy Placement in alternative living arrangements  PATIENT/FAMIILY INVOLVEMENT: This treatment plan has been presented to and reviewed with the patient, [name] [name] L Seeling, and/or family member.  The patient and family have been given the opportunity to ask questions and make suggestions.  Merian CapronFriedman, Amaury Kuzel [facility] 05/24/2013, 4:11 AM

## 2013-05-24 NOTE — Progress Notes (Signed)
Psychoeducational Group Note  Date:  05/24/2013 Time: 8:00 p.m.   Group Topic/Focus:  Wrap-Up Group:   The focus of this group is to help patients review their daily goal of treatment and discuss progress on daily workbooks.  Participation Level: Did Not Attend  Participation Quality:  Not Applicable  Affect:  Not Applicable  Cognitive:  Not Applicable  Insight:  Not Applicable  Engagement in Group: Not Applicable  Additional Comments:  The patient did not attend group this evening due to being placed on isolation precautions.   Hazle CocaGOODMAN, Rebecca Cairns S 05/24/2013, 9:56 PM

## 2013-05-24 NOTE — Consult Note (Signed)
  [facility].After admission he was found to have an additional abscess behind the rt ear and a rash in groin area.Also it was discovered he had had a treatment for head lice on 1/16.     Pt states sores began to appear 1 week ago.Rash in groin and thigh area is new to him.He says he was treated and given a nit comb for head lice  O-Hair is nearly matted with lice.Behind the rt ear is a 1cm abscess with typical appearance of MRSA lesion. The rt forearm has a raised 2 cm abscess with satellite lesions more superficial 0.5-1.0 cm in size.The groin area has typical appearance of tinea in intertrigenous area with what appears to be "id" reaction on medial thighs  A- Head Lice failed treatment x 1      Probable MRSA      Tinea Cruris  P- Culture abscess      Septra DS x 10 days      Bactroban TID with dressing change x 5 days      Monitor forearm abscess for possible need for I&D      Kwell shampoo treatment      Keep abscesses covered at all times      Repeat CBC 48 hrs with diff

## 2013-05-24 NOTE — Progress Notes (Signed)
Patient ID: [name] [name], male   DOB: [birth year], 53 y.o.   MRN: [id number] D. Orders in place for MRSA culture of wound. Swabs unavailable at this time (as not stocked on unit ) , Clinical research associatewriter phoned [name] at lab for swabs and stated would be sent at 1900 with next lab draw. Discussed above information with L. Earlene Plateravis NP as patient with scheduled antiobiotics ordered without results pending. Per NP, hold medications until able to obtain culture. Currently pt resting quietly and in no acute distress at this time. Discussed with Dr. Lynnae SandhoffAkthar and L. Padron NP, and patient would benefit from decreased stimulation to rest, as patient arrived late in the night and was up until 0400.  Will continue to monitor q 15 minutes for safety.
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
## 2013-05-24 NOTE — Progress Notes (Signed)
Patient ID: [name] [name], male   DOB: [birth year], 53 y.o.   MRN: [id number] D. Patient agitated pacing in room , hitting call bell. Patient would not respond verbally when staff approached but he did agree he was hearing voices. A. Pt offered prn medication and initially hesitant but did accept geodon 20mg  IM and ativan 2 mg IM. Low stimulation provided as patient clearly easily agitated and volatile. R. Patient with effective response to meds given. Will continue to monitor q 15 minutes for safety.

## 2013-05-24 NOTE — Progress Notes (Signed)
Patient ID: [name] [name] L Thai, male   DOB: [birth year], 53 y.o.   MRN: [id number] Per report from prior shift, patient has been very agitated, labile, responding to internal stimuli and threatening towards staff, Pt unable to follow directions at this time. Received report from previous RN that patient with suspected MRSA infection to wounds on arm and behind right ear. Pt also with Lice and matted hair. Discussed above information with L. Earlene Plateravis NP, Julieanne Cottonina AC RN , and Dr. Lynnae SandhoffAkthar as patient is unable at this time to comply with contact precautions and is acutely psychotic. No orders received. Pt currently in no acute distress at this time. Will continue to monitor q 15 minutes for safety.

## 2013-05-24 NOTE — [facility] Counselor (Signed)
LCSW unable to complete PSA as patient sleeping due to Geodon IM.  Patient has reportedly been irritable unable to sleep for extended period of time until injection. [name] Bernatherine C Nevaeh Korte, LCSW

## 2013-05-24 NOTE — Progress Notes (Signed)
Patient ID: London SheerFleming L Virtue, male   DOB: 29-Mar-1961, 53 y.o.   MRN: [id number] Psychoeducational Group Note  Date:  05/24/2013 Time:  1000am  Group Topic/Focus:  Making Healthy Choices:   The focus of this group is to help patients identify negative/unhealthy choices they were using prior to admission and identify positive/healthier coping strategies to replace them upon discharge.  Participation Level:  Did Not Attend  Participation Quality:    Affect Cognitive Insight: Engagement in Group:Additional Comments:  Inventory and Psychoeducational group- healthy support systems.   Valente DavidWeaver, Mazelle Huebert Brooks 05/24/2013,10:19 AM
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
## 2013-05-24 NOTE — ED Notes (Signed)
[facility] called to transport pt 

## 2013-05-24 NOTE — Progress Notes (Signed)
Pt vol admitted via [facility] for the treatment of depression, command AH to SI, as well as pt's worry that he may harm someone. Pt has hx of paranoid schizophrenia but stopped taking meds 2 years ago. Recently living in boarding house but left due to his increasing anxiety, depression and worry he would harm others. States he has times where he is so irritated he blacks out and fights. Pt now homeless. Pt arrived in paper scrubs with undressed abscess resembling that of a MRSA lesion on his R forearm. Area is raised approx 2cm, is red and painful. During skin search, additional lesion discovered behind the R ear with bloody drainage. Pt's hair also noted to be matted with lice. Pt remarked he had been treated in the ED for lice approx 24 hours prior however no mention was made during report from ED RN. Also noted was rash in and around groin area consistent with a fungal infection. Pt oriented to unit, meal provided and fall risk precautions put in place as pt reports more than 1 fall within the last 6 months. He denies VH and contracts for safety at this time. No medical hx or allergies. UDS +THC however pt states he has been clean (related to his past hx of other drug use) x 4 years and hasn't had alcohol in over 20 years.   Once on unit, pt was assisted with completing hygiene and clothes washed. PA called to unit, assessed pt's skin. At approximately 0345, pt became quite agitated and was observed pacing in his room. Pt on call bell stating, "I'm about to explode." Pt had been given trazadone for sleep at 0315 when wounds were dressed however it was ineffective and pt increasingly upset.  PA paged and emergent orders received to admin geodon 20mg  and ativan 2mg  both IM which pt allowed without incident. On reassess, pt is lying down however is still awake. Orders have been placed for treatment of wounds and lice which will need to take place once patient has had some rest and mood is more stable. Pt safe at this  time. Will continue to monitor closely. Lawrence MarseillesFriedman, Emmaline Wahba Eakes

## 2013-05-25 DIAGNOSIS — F411 Generalized anxiety disorder: Secondary | ICD-10-CM

## 2013-05-25 DIAGNOSIS — F29 Unspecified psychosis not due to a substance or known physiological condition: Secondary | ICD-10-CM

## 2013-05-25 DIAGNOSIS — F200 Paranoid schizophrenia: Principal | ICD-10-CM

## 2013-05-25 DIAGNOSIS — F332 Major depressive disorder, recurrent severe without psychotic features: Secondary | ICD-10-CM

## 2013-05-25 NOTE — Progress Notes (Signed)
[facility] MD Progress Note  05/25/2013 9:51 AM [name] [name]  MRN:  [id number] [facility]  Patient ordered a fine tooth comb to remove the rest of the lice eggs.  He has been treated twice, linens and room cleaned.  His depression is a 10/10 with suicidal ideations and he contracts for safety.  His sleep is "alright", appetite "good".  He denies hallucinations at this time. Diagnosis:   DSM5: Schizophrenia Disorders:  Schizophrenia (295.7)  Depressive Disorders:  Major Depressive Disorder - Severe (296.23)  Axis I: Anxiety Disorder NOS, Chronic Paranoid Schizophrenia and Major Depression, Recurrent severe Axis II: Deferred Axis III:  Past Medical History  Diagnosis Date  . Paranoid schizophrenia    Axis IV: other psychosocial or environmental problems, problems related to social environment and problems with primary support group Axis V: 41-50 serious symptoms  ADL's:  Intact  Sleep: Fair  Appetite:  Good  Suicidal Ideation:  Plan:  overdose Intent:  none Means:  none Homicidal Ideation:  Denies   Psychiatric Specialty Exam: Review of Systems  Constitutional: Negative.   HENT: Negative.   Eyes: Negative.   Respiratory: Negative.   Cardiovascular: Negative.   Gastrointestinal: Negative.   Genitourinary: Negative.   Musculoskeletal: Negative.   Skin: Negative.   Neurological: Negative.   Endo/Heme/Allergies: Negative.   Psychiatric/Behavioral: Positive for depression, suicidal ideas and hallucinations.    Blood pressure 153/76, pulse 93, temperature 97.5 F (36.4 C), temperature source Oral, resp. rate 18, height 5\' 6"  (1.676 m), weight 72.122 kg (159 lb).Body mass index is 25.68 kg/(m^2).  General Appearance: Casual  Eye Contact::  Minimal  Speech:  Normal Rate  Volume:  Decreased  Mood:  Depressed  Affect:  Congruent  Thought Process:  Coherent  Orientation:  Full (Time, Place, and Person)  Thought Content:  WDL  Suicidal Thoughts:  Yes.  without intent/plan   Homicidal Thoughts:  No  Memory:  Immediate;   Fair Recent;   Fair Remote;   Fair  Judgement:  Poor  Insight:  Lacking  Psychomotor Activity:  Decreased  Concentration:  Fair  Recall:  Fair  Akathisia:  No  Handed:  Right  AIMS (if indicated):     Assets:  Leisure Time Physical Health Resilience  Sleep:  Number of Hours: 6.75   Current Medications: Current Facility-Administered Medications  Medication Dose Route Frequency Provider Last Rate Last Dose  . acetaminophen (TYLENOL) tablet 650 mg  650 mg Oral Q6H PRN Court Joyharles E Kober, PA-C      . alum & mag hydroxide-simeth (MAALOX/MYLANTA) 200-200-20 MG/5ML suspension 30 mL  30 mL Oral Q4H PRN Court Joyharles E Kober, PA-C      . divalproex (DEPAKOTE ER) 24 hr tablet 500 mg  500 mg Oral QHS Fransisca KaufmannLaura Volner, NP   500 mg at 05/24/13 2110  . influenza vac split quadrivalent PF (FLUARIX) injection 0.5 mL  0.5 mL Intramuscular Tomorrow-1000 Aakash Hollomon      . LORazepam (ATIVAN) injection 2 mg  2 mg Intramuscular Q6H PRN Thresa RossNadeem Akhtar, MD   2 mg at 05/24/13 1715  . magnesium hydroxide (MILK OF MAGNESIA) suspension 30 mL  30 mL Oral Daily PRN Court Joyharles E Kober, PA-C      . mupirocin ointment (BACTROBAN) 2 %   Topical TID Court Joyharles E Kober, PA-C      . nicotine (NICODERM CQ - dosed in mg/24 hours) patch 21 mg  21 mg Transdermal Daily Court Joyharles E Kober, PA-C   21 mg at 05/25/13 0820  . OLANZapine  zydis (ZYPREXA) disintegrating tablet 10 mg  10 mg Oral QHS Fransisca Kaufmann, NP   10 mg at 05/24/13 2110  . ondansetron (ZOFRAN) tablet 4 mg  4 mg Oral Q8H PRN Court Joy, PA-C      . pneumococcal 23 valent vaccine (PNU-IMMUNE) injection 0.5 mL  0.5 mL Intramuscular Tomorrow-1000 Erven Ramson      . sulfamethoxazole-trimethoprim (BACTRIM DS) 800-160 MG per tablet 1 tablet  1 tablet Oral Q12H Court Joy, PA-C   1 tablet at 05/25/13 [id number]  . terbinafine (LAMISIL) 1 % cream   Topical BID Court Joy, PA-C      . traZODone (DESYREL) tablet 50 mg  50 mg Oral  QHS PRN,MR X 1 Court Joy, PA-C   50 mg at 05/24/13 2157  . ziprasidone (GEODON) injection 20 mg  20 mg Intramuscular Q12H PRN Thresa Ross, MD   20 mg at 05/24/13 1715    Lab Results: No results found for this or any previous visit (from the past 48 hour(s)).  Physical Findings: AIMS: Facial and Oral Movements Muscles of Facial Expression: None, normal Lips and Perioral Area: None, normal Jaw: None, normal Tongue: None, normal,Extremity Movements Upper (arms, wrists, hands, fingers): None, normal Lower (legs, knees, ankles, toes): None, normal, Trunk Movements Neck, shoulders, hips: None, normal, Overall Severity Severity of abnormal movements (highest score from questions above): None, normal Incapacitation due to abnormal movements: None, normal Patient's awareness of abnormal movements (rate only patient's report): No Awareness, Dental Status Current problems with teeth and/or dentures?: Yes (poor hygiene) Does patient usually wear dentures?: No  CIWA:    COWS:     Treatment Plan Summary: Daily contact with patient to assess and evaluate symptoms and progress in treatment Medication management  Plan:  Review of chart, vital signs, medications, and notes. 1-Individual and group therapy 2-Medication management for depression and anxiety:  Medications reviewed with the patient and he stated no negative side effects, no changes made 3-Coping skills for depression, anxiety, and psychosis 4-Continue crisis stabilization and management 5-Address health issues--monitoring vital signs, stable 6-Treatment plan in progress to prevent relapse of depression, psychosis, and anxiety  Medical Decision Making Problem Points:  Established problem, stable/improving (1) and Review of psycho-social stressors (1) Data Points:  Review of medication regiment & side effects (2)  I certify that inpatient services furnished can reasonably be expected to improve the patient's condition.    Nanine Means, PMH-NP 05/25/2013, 9:51 AM  I agreed with findings and treatment plan of this patient Thedore Mins, MD

## 2013-05-25 NOTE — [facility] Group Notes (Signed)
[facility] LCSW Group Therapy  05/25/2013 1:15 pm  Type of Therapy: Process Group Therapy  Participation Level:  Did not attend    Summary of Progress/Problems: Today's group addressed the issue of overcoming obstacles.  Patients were asked to identify their biggest obstacle post d/c that stands in the way of their on-going success, and then problem solve as to how to manage this.  Daryel Geraldorth, Adesuwa Osgood B 05/25/2013   4:25 PM

## 2013-05-25 NOTE — Progress Notes (Addendum)
Pt has been resting in room this evening, awakened for meds. He is flat and depressed. States the geodon and ativan he received earlier have helped with the voices. He endorses passive SI but verbally contracts for safety. Denies VH/HI. Patient actively scratching at nape of neck. Right forearm dressing changed as well as bactroban applied to draining wound behind R ear. Left open to air as patient is in his room at all times and wound is in his scalp/hair making it difficult for adhesive to stick. Pt afebrile and denies pain at wound sites. Pt supported, educated about infection prevention. Strongly encouraged to wash hands frequently and avoid scratching if at all possible.Communal unit laundry room treated with [employer] linen solution per NP order. Pt's stored belongings in search room locker bagged in biohazard and locker disinfected. Also reminded pt of fall precautions as pt reports hx of blacking out with falls. Pt currently sleeping and staff has maintained low stimulation this evening due to patient's propensity to quickly agitate and escalate. Pt safe.

## 2013-05-25 NOTE — Progress Notes (Signed)
D: Pt presents with flat affect and depressed mood. Pt denies AVH. Pt reports SI and contracts for safety at this time. Pt report feeling depressed today. Pt agitated and pacing in room. Pt given IM ativan for agitation. Writer assessed pt hair for lice. No active bugs noted. Writer noted nits in pt head (posterior).  Writer ordered fine tooth comb from central supply. Pt instructed that he will need to change linen daily and comb out nits TID. Writer will continue to assess pt progress to treatment. A: Medications given as ordered per MD. Verbal support given. 15 minute checks performed for safety. R: Pt safety maintained.

## 2013-05-25 NOTE — [facility] Group Notes (Signed)
[facility]   05/25/2013 12:17 PM  Participation Quality:  Did not attend due to isolation precautions due to head lice    Kiribatiorth, Baldo DaubRodney B

## 2013-05-26 DIAGNOSIS — F319 Bipolar disorder, unspecified: Secondary | ICD-10-CM | POA: Diagnosis present

## 2013-05-26 LAB — CBC WITH DIFFERENTIAL/PLATELET
Basophils Absolute: 0.1 10*3/uL (ref 0.0–0.1)
Basophils Relative: 1 % (ref 0–1)
EOS ABS: 0.6 10*3/uL (ref 0.0–0.7)
Eosinophils Relative: 8 % — ABNORMAL HIGH (ref 0–5)
HCT: 37.7 % — ABNORMAL LOW (ref 39.0–52.0)
Hemoglobin: 13.1 g/dL (ref 13.0–17.0)
Lymphocytes Relative: 37 % (ref 12–46)
Lymphs Abs: 2.6 10*3/uL (ref 0.7–4.0)
MCH: 31.7 pg (ref 26.0–34.0)
MCHC: 34.7 g/dL (ref 30.0–36.0)
MCV: 91.3 fL (ref 78.0–100.0)
Monocytes Absolute: 0.6 10*3/uL (ref 0.1–1.0)
Monocytes Relative: 8 % (ref 3–12)
NEUTROS PCT: 46 % (ref 43–77)
Neutro Abs: 3.1 10*3/uL (ref 1.7–7.7)
PLATELETS: 330 10*3/uL (ref 150–400)
RBC: 4.13 MIL/uL — ABNORMAL LOW (ref 4.22–5.81)
RDW: 12.8 % (ref 11.5–15.5)
WBC: 6.9 10*3/uL (ref 4.0–10.5)

## 2013-05-26 MED ORDER — OLANZAPINE 10 MG PO TBDP
10.0000 mg | ORAL_TABLET | Freq: Three times a day (TID) | ORAL | Status: DC | PRN
Start: 2013-05-26 — End: 2013-06-01
  Administered 2013-05-26 – 2013-06-01 (×4): 10 mg via ORAL
  Filled 2013-05-26 (×5): qty 1

## 2013-05-26 MED ORDER — HALOPERIDOL 5 MG PO TABS
5.0000 mg | ORAL_TABLET | Freq: Every day | ORAL | Status: DC
  Administered 2013-05-26: 5 mg via ORAL
  Filled 2013-05-26 (×2): qty 1

## 2013-05-26 NOTE — [facility] Suicide Risk Assessment (Signed)
[facility] INPATIENT:  Family/Significant Other Suicide Prevention Education  Suicide Prevention Education:  Patient Refusal for Family/Significant Other Suicide Prevention Education: The patient [name] [name] has refused to provide written consent for family/significant other to be provided Family/Significant Other Suicide Prevention Education during admission and/or prior to discharge.  Physician notified.  Daryel Geraldorth, Jessey Huyett B 05/26/2013, 12:51 PM

## 2013-05-26 NOTE — [facility] Group Notes (Signed)
[facility] Group Notes:  (Nursing/MHT/Case Management/Adjunct)  Date:  05/26/2013  Time: 900 am  Type of Therapy:  Nurse Education  Participation Level:  Did Not Attend  Participation Quality:  n/a  Affect:  n/a  Cognitive:  n/a  Insight:  None  Engagement in Group:  n/a  Modes of Intervention:  n/a  Summary of Progress/Problems:  [name] [name] L 05/26/2013, 11:34 AM

## 2013-05-26 NOTE — Progress Notes (Signed)
D: Pt reports active SI. Pt verbally contracts for safety at this time. Pt agitated this morning and stated that he do not want to go back to the shelter. Pt presents with flat affect and depressed mood. Pt reports feeling depressed 10/10 d/t his situation, not having a place to go. Pt denies feeling HI at this time. No AVH verbalized by pt. Pt taken off of contact precaution. Writer spoke with Vernona RiegerLaura in Infection Control and she stated that pt should be isolated for 24 hours after effective treatment. No active bugs or nits noted in pt hair. Writer along with housekeeping sprayed the pt room and furniture with Nix spray and bleach. Medications administered as ordered pt MD. Verbal support given. Pt encouraged to attend groups. 15 minute checks performed for safety. R: Pt safety maintained.

## 2013-05-26 NOTE — Progress Notes (Addendum)
Patient ID: [name] [name], male   DOB: 08/27/1960, 53 y.o.   MRN: [id number] [facility] MD Progress Note  05/26/2013 10:21 AM [name] [name]  MRN:  [id number] Subjective: "I have trouble controlling my anger and temper.'' Objective:  Patient reports ongoing mood lability, psychosis and frustrations. He reports hearing voices telling to hurt himself but says he feels safe in the hospital. He states that he is frustrated about being homeless with no money. He has tried to contact his relatives but unable to locate them. He states that his girlfriend who was helping him with money just pack her stuffs and left few days ago. He reports feeling hopeless, depressed, worthless and disillusion. He is complaint with his current medications and has not reported any adverse reactions.. Diagnosis:   DSM5: Schizophrenia Disorders:  Schizophrenia (295.7)  Depressive Disorders:  Major Depressive Disorder - Severe (296.23)  Axis I: Chronic Paranoid Schizophrenia.            Bipolar disorder-unspecified. Axis II: Deferred Axis III:  Past Medical History  Diagnosis Date  . Head Lice    Axis IV: other psychosocial or environmental problems, problems related to social environment and problems with primary support group Axis V: 41-50 serious symptoms  ADL's:  Intact  Sleep: Fair  Appetite:  Good  Suicidal Ideation: yes Plan:  overdose Intent:  none Means:  none Homicidal Ideation:  Denies   Psychiatric Specialty Exam: Review of Systems  Constitutional: Negative.   HENT: Negative.   Eyes: Negative.   Respiratory: Negative.   Cardiovascular: Negative.   Gastrointestinal: Negative.   Genitourinary: Negative.   Musculoskeletal: Negative.   Skin: Negative.   Neurological: Negative.   Endo/Heme/Allergies: Negative.   Psychiatric/Behavioral: Positive for depression, suicidal ideas and hallucinations.    Blood pressure 153/76, pulse 93, temperature 97.5 F (36.4 C), temperature source Oral, resp.  rate 18, height 5\' 6"  (1.676 m), weight 72.122 kg (159 lb).Body mass index is 25.68 kg/(m^2).  General Appearance: poorly groomed, dishelved  Patent attorney::  Minimal  Speech:  Normal Rate  Volume:  Decreased  Mood:  Depressed  Affect:  Congruent  Thought Process:  Coherent  Orientation:  Full (Time, Place, and Person)  Thought Content: psychostic  Suicidal Thoughts:  Yes.  without intent/plan  Homicidal Thoughts:  No  Memory:  Immediate;   Fair Recent;   Fair Remote;   Fair  Judgement:  Poor  Insight:  Lacking  Psychomotor Activity:  Decreased  Concentration:  Fair  Recall:  Fair  Akathisia:  No  Handed:  Right  AIMS (if indicated):     Assets:  Leisure Time Physical Health Resilience  Sleep:  Number of Hours: 6   Current Medications: Current Facility-Administered Medications  Medication Dose Route Frequency Provider Last Rate Last Dose  . acetaminophen (TYLENOL) tablet 650 mg  650 mg Oral Q6H PRN Court Joy, PA-C      . alum & mag hydroxide-simeth (MAALOX/MYLANTA) 200-200-20 MG/5ML suspension 30 mL  30 mL Oral Q4H PRN Court Joy, PA-C      . divalproex (DEPAKOTE ER) 24 hr tablet 500 mg  500 mg Oral QHS Fransisca Kaufmann, NP   500 mg at 05/25/13 2103  . haloperidol (HALDOL) tablet 5 mg  5 mg Oral QHS Tyra Gural      . influenza vac split quadrivalent PF (FLUARIX) injection 0.5 mL  0.5 mL Intramuscular Tomorrow-1000 Collyn Selk      . magnesium hydroxide (MILK OF MAGNESIA) suspension 30 mL  30 mL Oral Daily PRN Court Joyharles E Kober, PA-C      . mupirocin ointment (BACTROBAN) 2 %   Topical TID Court Joyharles E Kober, PA-C      . nicotine (NICODERM CQ - dosed in mg/24 hours) patch 21 mg  21 mg Transdermal Daily Court Joyharles E Kober, PA-C   21 mg at 05/25/13 0820  . OLANZapine zydis (ZYPREXA) disintegrating tablet 10 mg  10 mg Oral Q8H PRN Hershell Brandl      . ondansetron (ZOFRAN) tablet 4 mg  4 mg Oral Q8H PRN Court Joyharles E Kober, PA-C      . pneumococcal 23 valent vaccine (PNU-IMMUNE)  injection 0.5 mL  0.5 mL Intramuscular Tomorrow-1000 Morna Flud      . sulfamethoxazole-trimethoprim (BACTRIM DS) 800-160 MG per tablet 1 tablet  1 tablet Oral Q12H Court Joyharles E Kober, PA-C   1 tablet at 05/26/13 0757  . terbinafine (LAMISIL) 1 % cream   Topical BID Court Joyharles E Kober, PA-C      . traZODone (DESYREL) tablet 50 mg  50 mg Oral QHS PRN,MR X 1 Court Joyharles E Kober, PA-C   50 mg at 05/25/13 2103  . ziprasidone (GEODON) injection 20 mg  20 mg Intramuscular Q12H PRN Thresa RossNadeem Akhtar, MD   20 mg at 05/24/13 1715    Lab Results:  Results for orders placed during the hospital encounter of 05/24/13 (from the past 48 hour(s))  CBC WITH DIFFERENTIAL     Status: Abnormal   Collection Time    05/26/13  6:25 AM      Result Value Range   WBC 6.9  4.0 - 10.5 K/uL   RBC 4.13 (*) 4.22 - 5.81 MIL/uL   Hemoglobin 13.1  13.0 - 17.0 g/dL   HCT 40.937.7 (*) 81.139.0 - 91.452.0 %   MCV 91.3  78.0 - 100.0 fL   MCH 31.7  26.0 - 34.0 pg   MCHC 34.7  30.0 - 36.0 g/dL   RDW 78.212.8  95.611.5 - 21.315.5 %   Platelets 330  150 - 400 K/uL   Neutrophils Relative % 46  43 - 77 %   Neutro Abs 3.1  1.7 - 7.7 K/uL   Lymphocytes Relative 37  12 - 46 %   Lymphs Abs 2.6  0.7 - 4.0 K/uL   Monocytes Relative 8  3 - 12 %   Monocytes Absolute 0.6  0.1 - 1.0 K/uL   Eosinophils Relative 8 (*) 0 - 5 %   Eosinophils Absolute 0.6  0.0 - 0.7 K/uL   Basophils Relative 1  0 - 1 %   Basophils Absolute 0.1  0.0 - 0.1 K/uL   Comment[facility]    Physical Findings: AIMS: Facial and Oral Movements Muscles of Facial Expression: None, normal Lips and Perioral Area: None, normal Jaw: None, normal Tongue: None, normal,Extremity Movements Upper (arms, wrists, hands, fingers): None, normal Lower (legs, knees, ankles, toes): None, normal, Trunk Movements Neck, shoulders, hips: None, normal, Overall Severity Severity of abnormal movements (highest score from questions above): None, normal Incapacitation due to  abnormal movements: None, normal Patient's awareness of abnormal movements (rate only patient's report): No Awareness, Dental Status Current problems with teeth and/or dentures?: No Does patient usually wear dentures?: No  CIWA:    COWS:     Treatment Plan Summary: Daily contact with patient to assess and evaluate symptoms and progress in treatment Medication management  Plan:  Review of chart, vital signs, medications, and notes. 1-Individual and group  therapy 2-Medication management : Continue Depakote 500mg  po BID for Bipolar, Haldol 5mg  po Qhs for Psychosis and Trazodone 50mg  po Qhs as needed for Insomnia. 3-Coping skills for depression, anxiety, and psychosis 4-Continue crisis stabilization and management 5-Address health issues--monitoring vital signs, stable 6-Treatment plan in progress to prevent relapse of depression, psychosis, and anxiety 7- Patient received treatment for head lice. Medical Decision Making Problem Points:  Established problem, improving (1) and Review of psycho-social stressors (1) Data Points:  Review of medication regiment & side effects (2)  I certify that inpatient services furnished can reasonably be expected to improve the patient's condition.   Thedore Mins, MD 05/26/2013, 10:21 AM
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
## 2013-05-26 NOTE — Tx Team (Signed)
  Interdisciplinary Treatment Plan Update   Date Reviewed:  05/26/2013  Time Reviewed:  8:22 AM  Progress in Treatment:   Attending groups: Yes Participating in groups: Yes Taking medication as prescribed: Yes  Tolerating medication: Yes Family/Significant other contact made: No Patient understands diagnosis: Yes AEB asking for help with anger and agitation Discussing patient identified problems/goals with staff: Yes  See initial care plan Medical problems stabilized or resolved: Yes Denies suicidal/homicidal ideation: No  But contracts for safety Patient has not harmed self or others: Yes  For review of initial/current patient goals, please see plan of care.  Estimated Length of Stay:  4-5 days  Reason for Continuation of Hospitalization: Depression Medication stabilization Other; describe agitation, anger  New Problems/Goals identified:  N/A  Discharge Plan or Barriers:   return to the streets of [place], follow up outpt  Additional Comments:  [name] [name] is a 53 year old male who [facility] complaining of feeling suicidal for the last two weeks. Patient moved out of his boarding house to live on the streets for fear his feelings of agitation might lead him to hurt someone. In the ED he reported hearing voices telling him to kill himself by cutting his throat. Patient reported recent stressor that his girlfriend left him one week ago. Patient has been sleeping most of the day due to receiving IM geodon this morning due to acute agitation. Nursing staff reported that he was very agitated, labile, threatening staff and responding to internal stimuli. Upon waking up this afternoon the patient is much more pleasant and able to speak with this Clinical research associatewriter. Patient states "I wanted to kill myself. I am tired of everything. I have been hearing voices. Been off medications since 2009. I have no support. Just been wandering around in the cold trying to survive."   Attendees:   Signature: Thedore MinsMojeed Akintayo, MD 05/26/2013 8:22 AM   Signature: Richelle Itood Keymoni Mccaster, LCSW 05/26/2013 8:22 AM  Signature: Fransisca KaufmannLaura Jr, NP 05/26/2013 8:22 AM  Signature: Joslyn Devonaroline Beaudry, RN 05/26/2013 8:22 AM  Signature: Liborio NixonPatrice White, RN 05/26/2013 8:22 AM  Signature:  05/26/2013 8:22 AM  Signature:   05/26/2013 8:22 AM  Signature:    Signature:    Signature:    Signature:    Signature:    Signature:      Scribe for Treatment Team:   Richelle Itood Ali Mclaurin, LCSW  05/26/2013 8:22 AM

## 2013-05-26 NOTE — [facility] Group Notes (Signed)
[facility] LCSW Group Therapy  05/26/2013 , 12:35 PM   Type of Therapy:  Group Therapy  Participation Level:  Minimal  Participation Quality:  Attentive  Affect:  Appropriate  Cognitive:  Alert  Insight:  Improving  Engagement in Therapy:  Minimal  Modes of Intervention:  Discussion, Exploration and Socialization  Summary of Progress/Problems: Today's group focused on the term Diagnosis.  Participants were asked to define the term, and then pronounce whether it is a negative, positive or neutral term.  This was the first group [name] [name] was allowed to attend.  He slept throughout.  [name], [name] B 05/26/2013 , 12:35 PM

## 2013-05-26 NOTE — Progress Notes (Signed)
Psychoeducational Group Note  Date:  05/26/2013 Time:  8:00 p.m.   Group Topic/Focus:  Wrap-Up Group:   The focus of this group is to help patients review their daily goal of treatment and discuss progress on daily workbooks.  Participation Level: Did Not Attend  Participation Quality:  Not Applicable  Affect:  Not Applicable  Cognitive:  Not Applicable  Insight:  Not Applicable  Engagement in Group: Not Applicable  Additional Comments:  The patient did not attend group this evening since he was resting in his room.   Ranelle Auker S 05/26/2013, 10:06 PM

## 2013-05-26 NOTE — Progress Notes (Signed)
D:  Pt endorses SI but contracts for safety. Pt denies HI/VH. Pt is pleasant and cooperative. Pt just feels depressed about current situation and the events that led up to his admission. Pt really worried about where he is going after he leaves [facility]. Pt did spend a lot of time in the dayroom tonight, and was observed playing cards and watching TV.   A: Pt was offered support and encouragement. Pt was given scheduled medications. Pt was encourage to attend groups. Q 15 minute checks were done for safety.   R: Pt is taking medication. Pt receptive to treatment and safety maintained on unit.

## 2013-05-26 NOTE — ED Provider Notes (Signed)
Medical screening examination/treatment/procedure(s) were performed by non-physician practitioner and as supervising physician I was immediately available for consultation/collaboration.  EKG Interpretation   None         Suzi RootsKevin E Lanice Folden, MD 05/26/13 [phone]

## 2013-05-26 NOTE — Progress Notes (Signed)
Patient ID: [name] [name], male   DOB: 11-07-1960, 53 y.o.   MRN: [id number] D: Pt. Pacing back and forth in room, agitated " could have stayed in the fu--ing hospital had I know this" "I can't watch TV, just in the room". A: Writer introduced self to client and encouraged him to remain calm. Writer gave client snacks, medication and drink. R: Writer seemed appeased with the attending he was getting. Staff will monitor q4015min for safety. Pt. Is safe on the unit.

## 2013-05-27 ENCOUNTER — Ambulatory Visit (HOSPITAL_COMMUNITY)
Admit: 2013-05-27 | Discharge: 2013-05-27 | Disposition: A | Discharge status: Home / Self Care | Payer: No Typology Code available for payment source | Attending: Psychiatry | Admitting: Psychiatry

## 2013-05-27 LAB — WOUND CULTURE
CULTURE: NO GROWTH
GRAM STAIN: NONE SEEN

## 2013-05-27 MED ORDER — HALOPERIDOL 5 MG PO TABS
5.0000 mg | ORAL_TABLET | Freq: Two times a day (BID) | ORAL | Status: DC
  Administered 2013-05-27 – 2013-05-29 (×4): 5 mg via ORAL
  Filled 2013-05-27 (×8): qty 1

## 2013-05-27 MED ORDER — NAPROXEN 375 MG PO TABS
375.0000 mg | ORAL_TABLET | Freq: Two times a day (BID) | ORAL | Status: DC
  Administered 2013-05-27 – 2013-05-31 (×9): 375 mg via ORAL
  Filled 2013-05-27 (×13): qty 1

## 2013-05-27 MED ORDER — TRAZODONE HCL 150 MG PO TABS
150.0000 mg | ORAL_TABLET | Freq: Every day | ORAL | Status: DC
  Administered 2013-05-27: 150 mg via ORAL
  Filled 2013-05-27 (×2): qty 1

## 2013-05-27 NOTE — Progress Notes (Signed)
Patient ID: [name] [name], male   DOB: [birth year], 53 y.o.   MRN: [id number] [facility] MD Progress Note  05/27/2013 10:00 AM [name] [name]  MRN:  [id number] Subjective: Patient states "I am feeling depressed, angry, and hopeless. I was up pacing during the night feeling so agitated. I did not tell anyone but I've been hitting my hand against the wall. The voices tell me to hurt myself. I have constant suicidal thoughts but no way of killing myself."  Objective:  Patient reports ongoing symptoms of depression, psychosis, and agitation. He is experiencing auditory hallucinations telling him to kill himself. The patient reports constant SI with plan to overdose. He is rating his symptoms of depression and anxiety at ten. The patient appears severely depressed reporting that his girlfriend breaking up with him caused his symptoms to intensify. [name] [name] reports that he has pacing in his room to cope with his symptoms. He tells this Clinical research associate that he has been hitting his left hand on the wall. Upon examination his left hand appears mildly swollen with decreased ROM compared to his right hand.   Diagnosis:   DSM5: Schizophrenia Disorders:  Schizophrenia (295.7)  Depressive Disorders:  Major Depressive Disorder - Severe (296.23)  Axis I: Chronic Paranoid Schizophrenia.            Bipolar disorder-unspecified. Axis II: Deferred Axis III:  Past Medical History  Diagnosis Date  . Head Lice    Axis IV: other psychosocial or environmental problems, problems related to social environment and problems with primary support group Axis V: 41-50 serious symptoms  ADL's:  Intact  Sleep: Fair  Appetite:  Good  Suicidal Ideation: yes Plan:  overdose Intent:  none Means:  none Homicidal Ideation:  Denies   Psychiatric Specialty Exam: Review of Systems  Constitutional: Negative.   HENT: Negative.   Eyes: Negative.   Respiratory: Negative.   Cardiovascular: Negative.   Gastrointestinal: Negative.    Genitourinary: Negative.   Musculoskeletal: Positive for joint pain (Patient reports mild pain to left hand. ).  Skin: Negative.   Neurological: Negative.   Endo/Heme/Allergies: Negative.   Psychiatric/Behavioral: Positive for depression, suicidal ideas and hallucinations. Negative for memory loss and substance abuse. The patient is nervous/anxious and has insomnia.     Blood pressure 153/76, pulse 93, temperature 97.5 F (36.4 C), temperature source Oral, resp. rate 18, height 5\' 6"  (1.676 m), weight 72.122 kg (159 lb).Body mass index is 25.68 kg/(m^2).  General Appearance: Fairly groomed   Patent attorney::  Minimal  Speech:  Normal Rate  Volume:  Decreased  Mood:  Depressed  Affect:  Congruent  Thought Process:  Coherent  Orientation:  Full (Time, Place, and Person)  Thought Content: psychostic  Suicidal Thoughts:  Yes.  with intent/plan  Homicidal Thoughts:  No  Memory:  Immediate;   Fair Recent;   Fair Remote;   Fair  Judgement:  Poor  Insight:  Lacking  Psychomotor Activity:  Decreased  Concentration:  Fair  Recall:  Fair  Akathisia:  No  Handed:  Right  AIMS (if indicated):     Assets:  Leisure Time Physical Health Resilience  Sleep:  Number of Hours: 5.5   Current Medications: Current Facility-Administered Medications  Medication Dose Route Frequency Provider Last Rate Last Dose  . acetaminophen (TYLENOL) tablet 650 mg  650 mg Oral Q6H PRN Court Joy, PA-C      . alum & mag hydroxide-simeth (MAALOX/MYLANTA) 200-200-20 MG/5ML suspension 30 mL  30 mL Oral Q4H PRN Leonette Most  Peggye Fothergill, PA-C      . divalproex (DEPAKOTE ER) 24 hr tablet 500 mg  500 mg Oral QHS Fransisca Kaufmann, NP   500 mg at 05/26/13 2113  . haloperidol (HALDOL) tablet 5 mg  5 mg Oral QHS Quinci Gavidia   5 mg at 05/26/13 2113  . influenza vac split quadrivalent PF (FLUARIX) injection 0.5 mL  0.5 mL Intramuscular Tomorrow-1000 Jesaiah Fabiano      . magnesium hydroxide (MILK OF MAGNESIA) suspension 30 mL  30  mL Oral Daily PRN Court Joy, PA-C      . mupirocin ointment (BACTROBAN) 2 %   Topical TID Court Joy, PA-C      . nicotine (NICODERM CQ - dosed in mg/24 hours) patch 21 mg  21 mg Transdermal Daily Court Joy, PA-C   21 mg at 05/26/13 0800  . OLANZapine zydis (ZYPREXA) disintegrating tablet 10 mg  10 mg Oral Q8H PRN Blain Hunsucker   10 mg at 05/27/13 0756  . ondansetron (ZOFRAN) tablet 4 mg  4 mg Oral Q8H PRN Court Joy, PA-C      . pneumococcal 23 valent vaccine (PNU-IMMUNE) injection 0.5 mL  0.5 mL Intramuscular Tomorrow-1000 Shaquera Ansley      . sulfamethoxazole-trimethoprim (BACTRIM DS) 800-160 MG per tablet 1 tablet  1 tablet Oral Q12H Court Joy, PA-C   1 tablet at 05/27/13 0754  . terbinafine (LAMISIL) 1 % cream   Topical BID Court Joy, PA-C      . traZODone (DESYREL) tablet 50 mg  50 mg Oral QHS PRN,MR X 1 Court Joy, PA-C   50 mg at 05/27/13 0022  . ziprasidone (GEODON) injection 20 mg  20 mg Intramuscular Q12H PRN Thresa Ross, MD   20 mg at 05/24/13 1715    Lab Results:  Results for orders placed during the hospital encounter of 05/24/13 (from the past 48 hour(s))  CBC WITH DIFFERENTIAL     Status: Abnormal   Collection Time    05/26/13  6:25 AM      Result Value Range   WBC 6.9  4.0 - 10.5 K/uL   RBC 4.13 (*) 4.22 - 5.81 MIL/uL   Hemoglobin 13.1  13.0 - 17.0 g/dL   HCT 16.1 (*) 09.6 - 04.5 %   MCV 91.3  78.0 - 100.0 fL   MCH 31.7  26.0 - 34.0 pg   MCHC 34.7  30.0 - 36.0 g/dL   RDW 40.9  81.1 - 91.4 %   Platelets 330  150 - 400 K/uL   Neutrophils Relative % 46  43 - 77 %   Neutro Abs 3.1  1.7 - 7.7 K/uL   Lymphocytes Relative 37  12 - 46 %   Lymphs Abs 2.6  0.7 - 4.0 K/uL   Monocytes Relative 8  3 - 12 %   Monocytes Absolute 0.6  0.1 - 1.0 K/uL   Eosinophils Relative 8 (*) 0 - 5 %   Eosinophils Absolute 0.6  0.0 - 0.7 K/uL   Basophils Relative 1  0 - 1 %   Basophils Absolute 0.1  0.0 - 0.1 K/uL   Comment: Performed at [facility]    Physical Findings: AIMS: Facial and Oral Movements Muscles of Facial Expression: None, normal Lips and Perioral Area: None, normal Jaw: None, normal Tongue: None, normal,Extremity Movements Upper (arms, wrists, hands, fingers): None, normal Lower (legs, knees, ankles, toes): None, normal, Trunk Movements Neck, shoulders, hips: None,  normal, Overall Severity Severity of abnormal movements (highest score from questions above): None, normal Incapacitation due to abnormal movements: None, normal Patient's awareness of abnormal movements (rate only patient's report): No Awareness, Dental Status Current problems with teeth and/or dentures?: No Does patient usually wear dentures?: No  CIWA:    COWS:     Treatment Plan Summary: Daily contact with patient to assess and evaluate symptoms and progress in treatment Medication management  Plan:  Review of chart, vital signs, medications, and notes. 1-Individual and group therapy 2-Medication management : Continue Depakote 500mg  po BID for Bipolar, Increase Haldol to 5mg  po BID for Psychosis, Increase Trazodone to 150mg  po Qhs for complaint of insomnia.  3-Coping skills for depression, anxiety, and psychosis 4-Continue crisis stabilization and management 5-Address health issues--Order x-ray of left hand to rule out acute trauma. Start Naproxen 375 mg BID for inflammation of left hand, start ice to left hand to decrease swelling. D/C Bactrim as the patient's wound culture was negative for MRSA.  6-Treatment plan in progress to prevent relapse of depression, psychosis, and anxiety 7- Patient received treatment for head lice and has been cleared by ID to be taken off contact precautions.   Medical Decision Making Problem Points:  Established problem, worsening (3) and Review of psycho-social stressors (1), New problem with workup (1)  Data Points:  Review or order clinical lab tests (1) Review of medication regiment & side  effects (2) Review of new medications or change in dosage (2)  I certify that inpatient services furnished can reasonably be expected to improve the patient's condition.   Fransisca KaufmannDAVIS, LAURA, NP-C 05/27/2013, 10:00 AM   I agreed with findings and treatment plan of this patient Thedore MinsMojeed Dael Howland, MD

## 2013-05-27 NOTE — Progress Notes (Signed)
D: Pt presents anxious this morning. Pt agitated d/t auditory hallucinations telling him to kill himself and that he is worthless. Pt endorses active SI. Pt contracts for safety at this time. Pt given prn Zyprexa to decrease agitation. Pt observed pacing hallway this morning and appeared worried. Pt reports feeling depressed 10/10. Pt compliant with taking meds and attending groups today. A: Medications administered as ordered per MD. Verbal support given. Pt encouraged to attend groups. 15 minute checks performed for safety. R: Pt safety maintained.

## 2013-05-27 NOTE — [facility] Group Notes (Signed)
[facility]   05/27/2013 9:36 AM  Participation Quality:  Minimal   Mood/Affect:  Flat  Depression Rating:  8  Anxiety Rating:  8  Thoughts of Suicide:  No Will you contract for safety?   NA  Current AVH:  No  Plan for Discharge/Comments:  [name] [name] presented as depressed, however better mood than previously.  He stated that he did not sleep well.  The medication did not help with his sleep.  CSW informed him that he will have to talk to the doctor or nurse practitioner about his sleeping issues and adjusting his medications.  He indicated that his appetite is good.    Transportation Means: unknown  Supports:  unknown  Simona HuhYang, Tina

## 2013-05-27 NOTE — [facility] Group Notes (Signed)
[facility] [employer] Group Therapy  05/27/2013 , 2:44 PM    Type of Therapy:  [employer] Presentation  Participation Level:  Did not attend    Summary of Progress/Problems:  Onalee HuaDavid from [employer] came to present his recovery story and play the guitar.    Daryel Geraldorth, Abriella Filkins B 05/27/2013 , 2:44 PM

## 2013-05-27 NOTE — Progress Notes (Signed)
Patient ID: [name] [name], male   DOB: [birth year], 53 y.o.   MRN: [id number] D: Pt. In room, reports hearing voices to hurt himself "they want me out". Pt. Is able to contract for safety. A: Writer provided emotional support encouraged client to report to staff if voices become unbearable and he cannot contract for safety. Writer encouraged group. Staff will monitor q2515min for safety. R: pt. Is safe on the unit. Pt. Attended group.

## 2013-05-28 MED ORDER — DIVALPROEX SODIUM ER 500 MG PO TB24
750.0000 mg | ORAL_TABLET | Freq: Every day | ORAL | Status: DC
  Administered 2013-05-28 – 2013-05-31 (×4): 750 mg via ORAL
  Filled 2013-05-28 (×6): qty 1

## 2013-05-28 MED ORDER — MAGNESIUM CITRATE PO SOLN
1.0000 | Freq: Once | ORAL | Status: AC
  Administered 2013-05-28: 1 via ORAL

## 2013-05-28 MED ORDER — TRAZODONE HCL 100 MG PO TABS
200.0000 mg | ORAL_TABLET | Freq: Every day | ORAL | Status: DC
  Administered 2013-05-28 – 2013-05-31 (×4): 200 mg via ORAL
  Filled 2013-05-28 (×5): qty 2
  Filled 2013-05-28: qty 28

## 2013-05-28 NOTE — [facility] Group Notes (Signed)
[facility] Group Notes:  (Counselor/Nursing/MHT/Case Management/Adjunct)  05/28/2013 1:15PM  Type of Therapy:  Group Therapy  Participation Level:  Active  Participation Quality:  Appropriate  Affect:  Flat  Cognitive:  Oriented  Insight:  Improving  Engagement in Group:  Limited  Engagement in Therapy:  Limited  Modes of Intervention:  Discussion, Exploration and Socialization  Summary of Progress/Problems: The topic for group was balance in life.  Pt participated in the discussion about when their life was in balance and out of balance and how this feels.  Pt discussed ways to get back in balance and short term goals they can work on to get where they want to be. [name] [name] was a reluctant participant initially.  He became more activated as the conversation turned to handling conflict with others.  He was insistent that it is good to walk away from situations in which you may be tempted to do the wrong thing, but if faced with threatening situations, it is important to meet force with force.  Despite others pointing out that can potentially lead one to prison, he stuck with his guns and was not dissuaded.   [name] [name], [name] [name] 05/28/2013 1:29 PM

## 2013-05-28 NOTE — Progress Notes (Signed)
Morning Wellness  The focus of this group is to educate the patient on the purpose and policies of crisis stabilization and provide a format to answer questions about their admission. The group details unit policies and expectations of patients while admitted.   Spoke with patient individually. Patient has no concerns of stay here at this time. Patient states that his "medications need more time to work." Patient was referred to case manager for medication concerns.

## 2013-05-28 NOTE — Progress Notes (Signed)
Adult Psychoeducational Group Note  Date:  05/28/2013 Time:  9:18 PM  Group Topic/Focus:  Wrap-Up Group:   The focus of this group is to help patients review their daily goal of treatment and discuss progress on daily workbooks.  Participation Level:  Active  Participation Quality:  Appropriate  Affect:  Appropriate  Cognitive:  Alert  Insight: Appropriate  Engagement in Group:  Engaged  Modes of Intervention:  Discussion  Additional Comments:  Pt stated that he is having a good day and that he may be discharged tomorrow. Pt also stated that he plans to stay on his medications.  Kaleen OdeaCOOKE, Amiya Escamilla R 05/28/2013, 9:18 PM

## 2013-05-28 NOTE — Progress Notes (Signed)
D: Patient denies HI and visual hallucinations. The patient is positive for auditory hallucinations and is having passive SI. The patient has a depressed mood and affect. The patient reports that his medication are "starting to work" but states that he "needs more time here." The patient is attending groups and interacting appropriately within the milieu.  A: Patient given emotional support from RN. Patient encouraged to come to staff with concerns and/or questions. Patient's medication routine continued. Patient's orders and plan of care reviewed. Patient referred to MD and case manager for discharge concerns.  R: Patient remains cooperative. Will continue to monitor patient q15 minutes for safety.

## 2013-05-28 NOTE — Progress Notes (Signed)
Patient ID: [name] [name], male   DOB: 1961-04-22, 53 y.o.   MRN: [id number] D: pt. Reports concerns about when he'll be discharged and where he will be going. "I guess I'll go back to the shelter" Pt. Reports CM gave him some places to call for discharge follow up.  Pt. Reports voices have decreased and contracts for safety. Pt. Is visible on the unit, in the hall, showered and in dayroom.  A: Writer provided emotional support encouraged him to follow up on discharge plan given to him by CM. Pt. Encouraged to attend group. Staff will monitor q4315min for safety.  R: Pt. Is safe on the unit.

## 2013-05-28 NOTE — Tx Team (Signed)
  Interdisciplinary Treatment Plan Update   Date Reviewed:  05/28/2013  Time Reviewed:  3:08 PM  Progress in Treatment:   Attending groups: Yes Participating in groups: Yes Taking medication as prescribed: Yes  Tolerating medication: Yes Family/Significant other contact made: Yes  Patient understands diagnosis: Yes  Discussing patient identified problems/goals with staff: Yes Medical problems stabilized or resolved: Yes Denies suicidal/homicidal ideation: No, but contracts for safety Patient has not harmed self or others: Yes  For review of initial/current patient goals, please see plan of care.  Estimated Length of Stay:  4-5 days  Reason for Continuation of Hospitalization: Depression Hallucinations Medication stabilization Suicidal ideation  New Problems/Goals identified:  N/A  Discharge Plan or Barriers:   return to the streets of [place], follow up outpt  Additional Comments:  "I had difficulty sleeping last night and I am still hearing voices.''feeling depressed, angry, and hopeless. I was up pacing during the night feeling so agitated. I did not tell anyone but I've been hitting my hand against the wall. The voices tell me to hurt myself. I have constant suicidal thoughts but no way of killing myself."  Patient reports decreased depressive symptoms, agitation but still feeling irritable. He continues to report command auditory hallucinations-hearing voices telling him to hurt himself. He is reporting difficulty sleeping, feeling hopeless, worthless, excessive worries and recurrent suicidal thoughts with plan to overdose. However, he thinks his current medications are working but would like at least a little adjustment   Attendees:  Signature: Thedore MinsMojeed Akintayo, MD 05/28/2013 3:08 PM   Signature: Richelle Itood Ariahna Smiddy, LCSW 05/28/2013 3:08 PM  Signature: Fransisca KaufmannLaura Huster, NP 05/28/2013 3:08 PM  Signature: Joslyn Devonaroline Beaudry, RN 05/28/2013 3:08 PM  Signature: Liborio NixonPatrice White, RN 05/28/2013 3:08 PM   Signature:  05/28/2013 3:08 PM  Signature:   05/28/2013 3:08 PM  Signature:    Signature:    Signature:    Signature:    Signature:    Signature:      Scribe for Treatment Team:   Richelle Itood Marcos Peloso, LCSW  05/28/2013 3:08 PM

## 2013-05-28 NOTE — Progress Notes (Signed)
Patient ID: [name] [name], male   DOB: Sep 07, 1960, 53 y.o.   MRN: [id number] [facility] MD Progress Note  05/28/2013 11:03 AM [name] [name]  MRN:  [id number] Subjective: Patient states "I had difficulty sleeping last night and I am still hearing voices.''feeling depressed, angry, and hopeless. I was up pacing during the night feeling so agitated. I did not tell anyone but I've been hitting my hand against the wall. The voices tell me to hurt myself. I have constant suicidal thoughts but no way of killing myself."  Objective:  Patient reports decreased depressive symptoms, agitation but still feeling irritable. He continues to report command auditory hallucinations-hearing voices telling him to hurt himself. He is reporting difficulty sleeping, feeling hopeless, worthless, excessive worries and recurrent suicidal thoughts with plan to overdose. However, he thinks his current medications are working but would like at least a little adjustment. X-ray reports of patient's swollen right hand indicates no fracture.  Diagnosis:   DSM5: Schizophrenia Disorders:  Schizophrenia (295.7)  Depressive Disorders:  Major Depressive Disorder - Severe (296.23), Mood disorder  Axis I: Chronic Paranoid Schizophrenia.            Bipolar disorder-unspecified. Axis II: Deferred Axis III:  Past Medical History  Diagnosis Date  . Head Lice    Axis IV: other psychosocial or environmental problems, problems related to social environment and problems with primary support group Axis V: 41-50 serious symptoms  ADL's:  Intact  Sleep: Fair  Appetite:  Good  Suicidal Ideation: yes Plan:  overdose Intent:  none Means:  none Homicidal Ideation:  Denies   Psychiatric Specialty Exam: Review of Systems  Constitutional: Negative.   HENT: Negative.   Eyes: Negative.   Respiratory: Negative.   Cardiovascular: Negative.   Gastrointestinal: Negative.   Genitourinary: Negative.   Musculoskeletal: Positive for joint  pain (Patient reports mild pain to left hand. ).  Skin: Negative.   Neurological: Negative.   Endo/Heme/Allergies: Negative.   Psychiatric/Behavioral: Positive for depression, suicidal ideas and hallucinations. Negative for memory loss and substance abuse. The patient is nervous/anxious and has insomnia.     Blood pressure 96/62, pulse 75, temperature 97.8 F (36.6 C), temperature source Oral, resp. rate 18, height 5\' 6"  (1.676 m), weight 72.122 kg (159 lb).Body mass index is 25.68 kg/(m^2).  General Appearance: Fairly groomed   Patent attorney::  Minimal  Speech:  Normal Rate  Volume:  Decreased  Mood:  Depressed  Affect:  Congruent  Thought Process:  Coherent  Orientation:  Full (Time, Place, and Person)  Thought Content: psychostic  Suicidal Thoughts:  Yes.  with intent/plan  Homicidal Thoughts:  No  Memory:  Immediate;   Fair Recent;   Fair Remote;   Fair  Judgement:  Poor  Insight:  Lacking  Psychomotor Activity:  Decreased  Concentration:  Fair  Recall:  Fair  Akathisia:  No  Handed:  Right  AIMS (if indicated):     Assets:  Leisure Time Physical Health Resilience  Sleep:  Number of Hours: 4.75   Current Medications: Current Facility-Administered Medications  Medication Dose Route Frequency Provider Last Rate Last Dose  . acetaminophen (TYLENOL) tablet 650 mg  650 mg Oral Q6H PRN Court Joy, PA-C      . alum & mag hydroxide-simeth (MAALOX/MYLANTA) 200-200-20 MG/5ML suspension 30 mL  30 mL Oral Q4H PRN Court Joy, PA-C      . divalproex (DEPAKOTE ER) 24 hr tablet 750 mg  750 mg Oral QHS Shanielle Correll      .  haloperidol (HALDOL) tablet 5 mg  5 mg Oral BID Fransisca KaufmannLaura Harlin, NP   5 mg at 05/28/13 0759  . magnesium hydroxide (MILK OF MAGNESIA) suspension 30 mL  30 mL Oral Daily PRN Court Joyharles E Kober, PA-C   30 mL at 05/28/13 16100629  . mupirocin ointment (BACTROBAN) 2 %   Topical TID Court Joyharles E Kober, PA-C      . naproxen (NAPROSYN) tablet 375 mg  375 mg Oral BID WC Fransisca KaufmannLaura  Jenny, NP   375 mg at 05/28/13 0759  . nicotine (NICODERM CQ - dosed in mg/24 hours) patch 21 mg  21 mg Transdermal Daily Court Joyharles E Kober, PA-C   21 mg at 05/28/13 0800  . OLANZapine zydis (ZYPREXA) disintegrating tablet 10 mg  10 mg Oral Q8H PRN Juna Caban   10 mg at 05/27/13 0756  . ondansetron (ZOFRAN) tablet 4 mg  4 mg Oral Q8H PRN Court Joyharles E Kober, PA-C      . terbinafine (LAMISIL) 1 % cream   Topical BID Court Joyharles E Kober, PA-C      . traZODone (DESYREL) tablet 200 mg  200 mg Oral QHS Rayner Erman      . ziprasidone (GEODON) injection 20 mg  20 mg Intramuscular Q12H PRN Thresa RossNadeem Akhtar, MD   20 mg at 05/24/13 1715    Lab Results:  No results found for this or any previous visit (from the past 48 hour(s)).  Physical Findings: AIMS: Facial and Oral Movements Muscles of Facial Expression: None, normal Lips and Perioral Area: None, normal Jaw: None, normal Tongue: None, normal,Extremity Movements Upper (arms, wrists, hands, fingers): None, normal Lower (legs, knees, ankles, toes): None, normal, Trunk Movements Neck, shoulders, hips: None, normal, Overall Severity Severity of abnormal movements (highest score from questions above): None, normal Incapacitation due to abnormal movements: None, normal Patient's awareness of abnormal movements (rate only patient's report): No Awareness, Dental Status Current problems with teeth and/or dentures?: No Does patient usually wear dentures?: No  CIWA:    COWS:     Treatment Plan Summary: Daily contact with patient to assess and evaluate symptoms and progress in treatment Medication management  Plan:  Review of chart, vital signs, medications, and notes. 1-Individual and group therapy 2-Medication management : Increase Depakote to 750mg  po BID for Bipolar, conttinue Haldol  5mg  po BID for Psychosis, Increase Trazodone to 200mg  po Qhs for complaint of insomnia.  3-Coping skills for depression, anxiety, and psychosis 4-Continue crisis  stabilization and management 5-Address health issues--Order x-ray of left hand to rule out acute trauma. Continue Naproxen 375 mg BID for inflammation of left hand, ice to left hand to decrease swelling. 6-Treatment plan in progress to prevent relapse of depression, psychosis, and anxiety 7- Patient received treatment for head lice and has been cleared by ID to be taken off contact precautions.   Medical Decision Making Problem Points:  Established problem, improving (1) and Review of psycho-social stressors (1), New problem with workup (1)  Data Points:  Review or order clinical lab tests (1) Review of medication regiment & side effects (2) Review of new medications or change in dosage (2)  I certify that inpatient services furnished can reasonably be expected to improve the patient's condition.   Thedore MinsAkintayo, Noriko Macari, MD 05/28/2013, 11:03 AM

## 2013-05-29 MED ORDER — HALOPERIDOL 5 MG PO TABS
10.0000 mg | ORAL_TABLET | Freq: Two times a day (BID) | ORAL | Status: DC
  Administered 2013-05-29 – 2013-05-30 (×2): 10 mg via ORAL
  Filled 2013-05-29 (×6): qty 2

## 2013-05-29 MED ORDER — BENZTROPINE MESYLATE 1 MG PO TABS
1.0000 mg | ORAL_TABLET | Freq: Two times a day (BID) | ORAL | Status: DC
  Administered 2013-05-29 – 2013-06-01 (×6): 1 mg via ORAL
  Filled 2013-05-29 (×3): qty 1
  Filled 2013-05-29: qty 28
  Filled 2013-05-29 (×3): qty 1
  Filled 2013-05-29: qty 28
  Filled 2013-05-29 (×2): qty 1

## 2013-05-29 NOTE — [facility] Group Notes (Signed)
[facility] LCSW Group Therapy  05/29/2013  1:05 PM  Type of Therapy:  Group therapy  Participation Level:  Did not atend    Summary of Progress/Problems:  Chaplain was here to lead a group on themes of hope and courage.  [name] [name], [name] [name] 05/29/2013 1:43 PM

## 2013-05-29 NOTE — [facility] Group Notes (Signed)
[facility]   05/29/2013 10:13 AM  Participation Quality:  Engaged  Mood/Affect:  Depressed  Depression Rating:  10  Anxiety Rating:  4  Thoughts of Suicide:  No Will you contract for safety?   NA  Current AVH:  No  Plan for Discharge/Comments:  [name] states that he did not sleep well last night.  He does say that he is not experiencing psychosis or SI this AM.  Dr told him he would be d/ced on Monday.  Transportation Means: bus  Supports: none  [place], [place]

## 2013-05-29 NOTE — Progress Notes (Signed)
Patient ID: [name] [name] Luis, male   DOB: 1960-11-09, 53 y.o.   MR[facility]: [id number] [facility]  05/29/2013 11:20 AM [name] [name] Byus  MRN:  [id number] Subjective: Patient states "I am still hearing voices especially at night.'' Objective:  Patient reports ongoing command auditory hallucinations-hearing voices telling him to hurt himself. He is also reporting difficulty sleeping, feeling hopeless, worthless, excessive worries and recurrent suicidal thoughts with plan to overdose. However, he is compliant with his medications and denies any adverse reactions. Diagnosis:   DSM5: Schizophrenia Disorders:  Schizophrenia (295.7)  Depressive Disorders:  Major Depressive Disorder - Severe (296.23), Mood disorder  Axis I: Chronic Paranoid Schizophrenia.            Bipolar disorder-unspecified. Axis II: Deferred Axis III:  Past Medical History  Diagnosis Date  . Head Lice    Axis IV: other psychosocial or environmental problems, problems related to social environment and problems with primary support group Axis V: 41-50 serious symptoms  ADL's:  Intact  Sleep: Fair  Appetite:  Good  Suicidal Ideation: yes Plan:  overdose Intent:  none Means:  none Homicidal Ideation:  Denies   Psychiatric Specialty Exam: Review of Systems  Constitutional: Negative.   HENT: Negative.   Eyes: Negative.   Respiratory: Negative.   Cardiovascular: Negative.   Gastrointestinal: Negative.   Genitourinary: Negative.   Musculoskeletal: Positive for joint pain (Patient reports mild pain to left hand. ).  Skin: Negative.   Neurological: Negative.   Endo/Heme/Allergies: Negative.   Psychiatric/Behavioral: Positive for depression, suicidal ideas and hallucinations. Negative for memory loss and substance abuse. The patient is nervous/anxious and has insomnia.     Blood pressure 96/59, pulse 72, temperature 97.5 F (36.4 C), temperature source Oral, resp. rate 18, height 5\' 6"  (1.676 m), weight 72.122  kg (159 lb).Body mass index is 25.68 kg/(m^2).  General Appearance: Fairly groomed   Patent attorneyye Contact::  Minimal  Speech:  Normal Rate  Volume:  Decreased  Mood:  Depressed  Affect:  Congruent  Thought Process:  Coherent  Orientation:  Full (Time, Place, and Person)  Thought Content: psychostic  Suicidal Thoughts:  Yes.  with intent/plan  Homicidal Thoughts:  No  Memory:  Immediate;   Fair Recent;   Fair Remote;   Fair  Judgement:  Poor  Insight:  Lacking  Psychomotor Activity:  Decreased  Concentration:  Fair  Recall:  Fair  Akathisia:  No  Handed:  Right  AIMS (if indicated):     Assets:  Leisure Time Physical Health Resilience  Sleep:  Number of Hours: 6.75   Current Medications: Current Facility-Administered Medications  Medication Dose Route Frequency Provider Last Rate Last Dose  . acetaminophen (TYLENOL) tablet 650 mg  650 mg Oral Q6H PRN Court Joyharles E Kober, PA-C      . alum & mag hydroxide-simeth (MAALOX/MYLANTA) 200-200-20 MG/5ML suspension 30 mL  30 mL Oral Q4H PRN Court Joyharles E Kober, PA-C      . benztropine (COGENTIN) tablet 1 mg  1 mg Oral BID Dinita Migliaccio      . divalproex (DEPAKOTE ER) 24 hr tablet 750 mg  750 mg Oral QHS Josceline Chenard   750 mg at 05/28/13 2141  . haloperidol (HALDOL) tablet 10 mg  10 mg Oral BID Likisha Alles      . magnesium hydroxide (MILK OF MAGNESIA) suspension 30 mL  30 mL Oral Daily PRN Court Joyharles E Kober, PA-C   30 mL at 05/28/13 0629  . naproxen (NAPROSYN) tablet 375 mg  375 mg Oral BID WC Fransisca Kaufmann, NP   375 mg at 05/29/13 0731  . nicotine (NICODERM CQ - dosed in mg/24 hours) patch 21 mg  21 mg Transdermal Daily Court Joy, PA-C   21 mg at 05/29/13 0732  . OLANZapine zydis (ZYPREXA) disintegrating tablet 10 mg  10 mg Oral Q8H PRN Haislee Corso   10 mg at 05/27/13 0756  . ondansetron (ZOFRAN) tablet 4 mg  4 mg Oral Q8H PRN Court Joy, PA-C      . terbinafine (LAMISIL) 1 % cream   Topical BID Court Joy, PA-C      .  traZODone (DESYREL) tablet 200 mg  200 mg Oral QHS Thurl Boen   200 mg at 05/28/13 2142  . ziprasidone (GEODON) injection 20 mg  20 mg Intramuscular Q12H PRN Thresa Ross, MD   20 mg at 05/24/13 1715    Lab Results:  No results found for this or any previous visit (from the past 48 hour(s)).  Physical Findings: AIMS: Facial and Oral Movements Muscles of Facial Expression: None, normal Lips and Perioral Area: None, normal Jaw: None, normal Tongue: None, normal,Extremity Movements Upper (arms, wrists, hands, fingers): None, normal Lower (legs, knees, ankles, toes): None, normal, Trunk Movements Neck, shoulders, hips: None, normal, Overall Severity Severity of abnormal movements (highest score from questions above): None, normal Incapacitation due to abnormal movements: None, normal Patient's awareness of abnormal movements (rate only patient's report): No Awareness, Dental Status Current problems with teeth and/or dentures?: No Does patient usually wear dentures?: No  CIWA:    COWS:     Treatment Plan Summary: Daily contact with patient to assess and evaluate symptoms and progress in treatment Medication management  Plan:  Review of chart, vital signs, medications, and notes. 1-Individual and group therapy 2-Medication management : Continue Depakote to 750mg  po BID for Bipolar, Increase Haldol  To 10mg  po BID for Psychosis, Increase Trazodone to 200mg  po Qhs for complaint of insomnia.  3-Coping skills for depression, anxiety, and psychosis 4-Continue crisis stabilization and management 5-Address health issues--Order x-ray of left hand to rule out acute trauma. Continue Naproxen 375 mg BID for inflammation of left hand, ice to left hand to decrease swelling. 6-Treatment plan in progress to prevent relapse of depression, psychosis, and anxiety 7- Patient received treatment for head lice and has been cleared by ID to be taken off contact precautions.   Medical Decision  Making Problem Points:  Established problem, improving (1) and Review of psycho-social stressors (1), New problem with workup (1)  Data Points:  Review or order clinical lab tests (1) Review of medication regiment & side effects (2) Review of new medications or change in dosage (2)  I certify that inpatient services furnished can reasonably be expected to improve the patient's condition.   Thedore Mins, MD 05/29/2013, 11:20 AM

## 2013-05-30 MED ORDER — HALOPERIDOL 5 MG PO TABS
10.0000 mg | ORAL_TABLET | Freq: Three times a day (TID) | ORAL | Status: DC
  Administered 2013-05-30 – 2013-06-01 (×5): 10 mg via ORAL
  Filled 2013-05-30: qty 84
  Filled 2013-05-30 (×2): qty 2
  Filled 2013-05-30: qty 84
  Filled 2013-05-30 (×5): qty 2
  Filled 2013-05-30: qty 84
  Filled 2013-05-30: qty 2

## 2013-05-30 NOTE — [facility] Group Notes (Signed)
[facility] LCSW Group Therapy Note  05/30/2013 11:10  - 11:40 AM  Type of Therapy and Topic:  Group Therapy: Avoiding Self-Sabotaging and Enabling Behaviors  Participation Level:  Did Not Attend     Carney Bernatherine C Joden Bonsall, LCSW

## 2013-05-30 NOTE — Progress Notes (Signed)
Patient ID: [name] [name], male   DOB: 1960-06-01, 53 y.o.   MRN: [id number] Patient asleep; no s/s of distress noted at this time. Respirations regular and unlabored.

## 2013-05-30 NOTE — Progress Notes (Signed)
Patient ID: [name] [name], male   DOB: [birth year], 53 y.o.   MRN: [id number] Psychoeducational Group Note  Date:  05/30/2013 Time:1000am  Group Topic/Focus:  Identifying Needs:   The focus of this group is to help patients identify their personal needs that have been historically problematic and identify healthy behaviors to address their needs.  Participation Level:  Did Not Attend  Participation Quality:    Affect: Cognitive:  Insight: Engagement in Group:  Additional Comments:  Inventory and Psychoeducational group- healthy coping skills.   Valente DavidWeaver, Georgene Kopper Brooks 05/30/2013,9:47 AM

## 2013-05-30 NOTE — Progress Notes (Signed)
Patient ID: [name] [name], male   DOB: 1960-10-17, 53 y.o.   MRN: [id number] 05-30-13 nursing shift note: D: RN sat in with the MD during this patient's assessment. Pt has a hx of schizophrenia, is homeless and hears voices that tell him to hurt himself.  He broke up with his girlfriend of 7 yrs and lost his home. He has been seen at [facility] in [place], [place] in the past. She stated his sleep is improving. He refused to fill out his inventory sheet. A: staff continues to encourage this pt to go to groups and be more visible in the milieu. He stated his auditory hallucinations are worse in the evening, and he got a Zyprexa prn at 1427. R: he denied any si/hi or visual hallucinations presently. RN will monitor and Q 15 min ck's continue.

## 2013-05-30 NOTE — Progress Notes (Signed)
Patient ID: [name] [name], male   DOB: December 02, 1960, 53 y.o.   MRN: [id number] [facility] MD Progress Note  05/30/2013 2:18 PM [name] [name]  MRN:  [id number] Subjective: "i'm still hearing voices when the medications wear of in the afternoon." Objective:   HPI Comments: [name] [name] is  a 53 y/o male with a past psychiatric history significant for Chronic Paranoid Schizophrenia, Bipolar disorder-unspecified.. The patient is referred for psychiatric services for and medication management.   . Location-Patient reports ongoing command auditory hallucinations-hearing voices telling him to hurt himself.  . Quality: The patient reports that his  main stressors are:  "no place to go"- he and his girlfriend broke up.  In the area of affective symptoms, patient appears depressed. Patient endorses current suicidal ideation, intent, or plan. Patient denies current homicidal ideation, intent, or plan. Patient endorses auditory hallucinations. Patient denies visual hallucinations. Patient endorses symptoms of paranoia. Patient states sleep is improved, with approximately 4-5 hours of sleep per night. Appetite is good. Energy level is low. Patient endorses symptoms of anhedonia. Patient endorses hopelessness, helplessness, or guilt.  . Severity: Severe  . Duration: Diagnosed 3-4 years seen in [facility] [place]  . Timing: Afternoons and Evenings are worse as he feels his medicine wears off. He is required to take PRN Olanzapine. . Context-loss of relationship with girlfriend of 7 years; homelessness . Modifying factors:  No factors currently improve mood.    Diagnosis:   DSM5: Schizophrenia Disorders:  Schizophrenia (295.7)  Depressive Disorders:  Major Depressive Disorder - Severe (296.23), Mood disorder  Axis I: Chronic Paranoid Schizophrenia.            Bipolar disorder-unspecified. Axis II: Deferred Axis III:  Past Medical History  Diagnosis Date  . Head Lice    Axis IV: other psychosocial or  environmental problems, problems related to social environment and problems with primary support group Axis V: 41-50 serious symptoms  ADL's:  Intact  Sleep: Fair  Appetite:  Good  Suicidal Ideation: yes Plan:  overdose Intent:  none Means:  none Homicidal Ideation:  Denies   Psychiatric Specialty Exam: Review of Systems  Constitutional: Negative.   HENT: Negative.   Eyes: Negative.   Respiratory: Negative.   Cardiovascular: Negative.   Gastrointestinal: Negative.   Genitourinary: Negative.   Musculoskeletal: Positive for joint pain (Patient reports mild pain to left hand. ).  Skin: Negative.   Neurological: Negative.   Endo/Heme/Allergies: Negative.   Psychiatric/Behavioral: Positive for depression, suicidal ideas and hallucinations. Negative for memory loss and substance abuse. The patient is nervous/anxious and has insomnia.     Blood pressure 95/60, pulse 83, temperature 98 F (36.7 C), temperature source Oral, resp. rate 18, height 5\' 6"  (1.676 m), weight 72.122 kg (159 lb).Body mass index is 25.68 kg/(m^2).  General Appearance: Fairly groomed   Patent attorney::  Minimal  Speech:  Normal Rate  Volume:  Decreased  Mood:  Depressed  Affect:  Congruent  Thought Process:  Coherent  Orientation:  Full (Time, Place, and Person)  Thought Content: psychostic  Suicidal Thoughts:  Yes.  with intent/plan  Homicidal Thoughts:  No  Memory:  Immediate;   Fair Recent;   Fair Remote;   Fair  Judgement:  Poor  Insight:  Lacking  Psychomotor Activity:  Decreased  Concentration:  Fair  Recall:  Fair  Akathisia:  No  Handed:  Left  Language-intact  Fund of knowledge-averaage.  AIMS (if indicated):   As noted in chart  Assets:  Leisure Time  Physical Health Resilience  Sleep:  Number of Hours: 6.75   Current Medications: Current Facility-Administered Medications  Medication Dose Route Frequency Provider Last Rate Last Dose  . acetaminophen (TYLENOL) tablet 650 mg  650 mg  Oral Q6H PRN Court Joy, PA-C      . alum & mag hydroxide-simeth (MAALOX/MYLANTA) 200-200-20 MG/5ML suspension 30 mL  30 mL Oral Q4H PRN Court Joy, PA-C   30 mL at 05/29/13 1813  . benztropine (COGENTIN) tablet 1 mg  1 mg Oral BID Mojeed Akintayo   1 mg at 05/30/13 0821  . divalproex (DEPAKOTE ER) 24 hr tablet 750 mg  750 mg Oral QHS Mojeed Akintayo   750 mg at 05/29/13 2112  . haloperidol (HALDOL) tablet 10 mg  10 mg Oral BID Mojeed Akintayo   10 mg at 05/30/13 0821  . magnesium hydroxide (MILK OF MAGNESIA) suspension 30 mL  30 mL Oral Daily PRN Court Joy, PA-C   30 mL at 05/28/13 1610  . naproxen (NAPROSYN) tablet 375 mg  375 mg Oral BID WC Fransisca Kaufmann, NP   375 mg at 05/30/13 9604  . nicotine (NICODERM CQ - dosed in mg/24 hours) patch 21 mg  21 mg Transdermal Daily Court Joy, PA-C   21 mg at 05/29/13 0732  . OLANZapine zydis (ZYPREXA) disintegrating tablet 10 mg  10 mg Oral Q8H PRN Mojeed Akintayo   10 mg at 05/27/13 0756  . ondansetron (ZOFRAN) tablet 4 mg  4 mg Oral Q8H PRN Court Joy, PA-C      . terbinafine (LAMISIL) 1 % cream   Topical BID Court Joy, PA-C      . traZODone (DESYREL) tablet 200 mg  200 mg Oral QHS Mojeed Akintayo   200 mg at 05/29/13 2112  . ziprasidone (GEODON) injection 20 mg  20 mg Intramuscular Q12H PRN Thresa Ross, MD   20 mg at 05/24/13 1715    Lab Results:  No results found for this or any previous visit (from the past 48 hour(s)).  Physical Findings: AIMS: Facial and Oral Movements Muscles of Facial Expression: None, normal Lips and Perioral Area: None, normal Jaw: None, normal Tongue: None, normal,Extremity Movements Upper (arms, wrists, hands, fingers): None, normal Lower (legs, knees, ankles, toes): None, normal, Trunk Movements Neck, shoulders, hips: None, normal, Overall Severity Severity of abnormal movements (highest score from questions above): None, normal Incapacitation due to abnormal movements: None,  normal Patient's awareness of abnormal movements (rate only patient's report): No Awareness, Dental Status Current problems with teeth and/or dentures?: No Does patient usually wear dentures?: No  CIWA:    COWS:     Treatment Plan Summary: Daily contact with patient to assess and evaluate symptoms and progress in treatment Medication management  Plan:  Review of chart, vital signs, medications, and notes. 1-Individual and group therapy 2-Medication management :  A) Continue Depakote to 750mg  po BID for Bipolar,  B) Increase Haldol  To 10 mg po TID for Psychosis,  C) Increase Trazodone to 200 mg po Qhs for complaint of insomnia.  3-Coping skills for depression, anxiety, and psychosis 4-Continue crisis stabilization and management 5-Address health issues-- Continue Naproxen 375 mg BID for inflammation of left hand, ice to left hand to decrease swelling.  6-Treatment plan in progress to prevent relapse of depression, psychosis, and anxiety 7- Patient received treatment for head lice and has been cleared by ID to be taken off contact precautions.   Medical Decision Making Problem Points:  Established problem, improving (1) and Review of psycho-social stressors (1), New problem with workup (1)  Data Points:  Review or order clinical lab tests (1) Review of medication regiment & side effects (2) Review of new medications or change in dosage (2)  I certify that inpatient services furnished can reasonably be expected to improve the patient's condition.   Jacqulyn CanePUTHUVEL, Norrine Ballester, MD 05/30/2013, 2:18 PM

## 2013-05-30 NOTE — Progress Notes (Signed)
Psychoeducational Group Note  Date:  05/30/2013 Time:  8:00 p.m.   Group Topic/Focus:  Wrap-Up Group:   The focus of this group is to help patients review their daily goal of treatment and discuss progress on daily workbooks.  Participation Level: Did Not Attend  Participation Quality:  Not Applicable  Affect:  Not Applicable  Cognitive:  Not Applicable  Insight:  Not Applicable  Engagement in Group: Not Applicable  Additional Comments: The patient didn't attend group this evening since he was asleep in his room.   Hazle CocaGOODMAN, Hershy Flenner S 05/30/2013, 12:59 AM

## 2013-05-30 NOTE — Progress Notes (Signed)
Psychoeducational Group Note  Date:  05/30/2013 Time:  8:00 p.m.   Group Topic/Focus:  Wrap-Up Group:   The focus of this group is to help patients review their daily goal of treatment and discuss progress on daily workbooks.  Participation Level: Did Not Attend  Participation Quality:  Not Applicable  Affect:  Not Applicable  Cognitive:  Not Applicable  Insight:  Not Applicable  Engagement in Group: Not Applicable  Additional Comments:  The patient did not attend group since he was asleep in his bed.   Hazle CocaGOODMAN, Elonna Mcfarlane S 05/30/2013, 10:13 PM

## 2013-05-31 NOTE — Progress Notes (Signed)
Patient ID: [name] [name], male   DOB: 10-Apr-1961, 53 y.o.   MRN: [id number] [facility] Progress Note  05/31/2013 5:14 PM [name] SheerFleming L Kanzler  MRN:  [id number] Subjective: "i'm still hearing voices when the medications wear of in the afternoon." Objective:   HPI Comments: Mr. [name] [name] is  a 53 y/o male with a past psychiatric history significant for Chronic Paranoid Schizophrenia, Bipolar disorder-unspecified.  . Location-Patient reports voices telling him " to hurt myself and I don't want to be here." . Quality: The patient reports that he has only gone to one group today. He states his anxiety has prevented him from going to the other group and he reports having difficulty paying attention.  In the area of affective symptoms, patient appears depressed. Patient denies suicidal ideation, intent, or plan. Patient denies current homicidal ideation, intent, or plan. Patient endorses auditory hallucinations. Patient denies visual hallucinations. Patient endorses symptoms of paranoia. Patient states sleep is good, with approximately 6 hours of sleep per night. Appetite is good. Energy level is low. Patient endorses symptoms of anhedonia. Patient endorses hopelessness, helplessness, but denies guilt.  . Severity: Severe  . Duration: Diagnosed 3-4 years seen in [facility]  . Timing: Afternoons and Evenings are worse as he feels his medicine wears off. He is required to take PRN Olanzapine. . Context-loss of relationship with girlfriend of 7 years; homelessness . Modifying factors:  No factors currently improve mood.    Diagnosis:   DSM5: Schizophrenia Disorders:  Schizophrenia (295.7)  Depressive Disorders:  Major Depressive Disorder - Severe (296.23), Mood disorder  Axis I: Chronic Paranoid Schizophrenia.            Bipolar disorder-unspecified. Axis II: Deferred Axis III:  Past Medical History  Diagnosis Date  . Head Lice    Axis IV: other psychosocial or environmental problems,  problems related to social environment and problems with primary support group Axis V: 41-50 serious symptoms  ADL's:  Intact  Sleep: Fair  Appetite:  Good  Suicidal Ideation: yes Plan:  overdose Intent:  none Means:  none Homicidal Ideation:  Denies   Psychiatric Specialty Exam: Review of Systems  Constitutional: Negative.   HENT: Negative.   Eyes: Negative.   Respiratory: Negative.   Cardiovascular: Negative.   Gastrointestinal: Negative.   Genitourinary: Negative.   Musculoskeletal: Positive for joint pain (Patient reports mild pain to left hand. ).  Skin: Negative.   Neurological: Negative.   Endo/Heme/Allergies: Negative.   Psychiatric/Behavioral: Positive for depression, suicidal ideas and hallucinations. Negative for memory loss and substance abuse. The patient is nervous/anxious and has insomnia.     Blood pressure 89/44, pulse 83, temperature 98 F (36.7 C), temperature source Oral, resp. rate 18, height 5\' 6"  (1.676 m), weight 72.122 kg (159 lb).Body mass index is 25.68 kg/(m^2).  General Appearance: Fairly groomed   Patent attorneyye Contact::  Minimal  Speech:  Normal Rate  Volume:  Decreased  Mood:  Depressed  Affect:  Congruent  Thought Process:  Coherent  Orientation:  Full (Time, Place, and Person)  Thought Content: psychostic  Suicidal Thoughts:  Yes.  without intent/plan  Homicidal Thoughts:  No  Memory:  Immediate;   Fair Recent;   Fair Remote;   Fair  Judgement:  Poor  Insight:  Lacking  Psychomotor Activity:  Decreased  Concentration:  Fair  Recall:  Fair  Akathisia:  No  Handed:  Left  Language-intact  Fund of knowledge-averaage.  AIMS (if indicated):   As noted in chart  Assets:  Leisure Time Physical Health Resilience  Sleep:  Number of Hours: 6.25   Current Medications: Current Facility-Administered Medications  Medication Dose Route Frequency Provider Last Rate Last Dose  . acetaminophen (TYLENOL) tablet 650 mg  650 mg Oral Q6H PRN Court Joy, PA-C      . alum & mag hydroxide-simeth (MAALOX/MYLANTA) 200-200-20 MG/5ML suspension 30 mL  30 mL Oral Q4H PRN Court Joy, PA-C   30 mL at 05/29/13 1813  . benztropine (COGENTIN) tablet 1 mg  1 mg Oral BID Mojeed Akintayo   1 mg at 05/31/13 1659  . divalproex (DEPAKOTE ER) 24 hr tablet 750 mg  750 mg Oral QHS Mojeed Akintayo   750 mg at 05/30/13 2120  . haloperidol (HALDOL) tablet 10 mg  10 mg Oral TID Larena Sox, MD   10 mg at 05/31/13 1658  . magnesium hydroxide (MILK OF MAGNESIA) suspension 30 mL  30 mL Oral Daily PRN Court Joy, PA-C   30 mL at 05/28/13 1610  . naproxen (NAPROSYN) tablet 375 mg  375 mg Oral BID WC Fransisca Kaufmann, NP   375 mg at 05/31/13 1658  . nicotine (NICODERM CQ - dosed in mg/24 hours) patch 21 mg  21 mg Transdermal Daily Court Joy, PA-C   21 mg at 05/29/13 0732  . OLANZapine zydis (ZYPREXA) disintegrating tablet 10 mg  10 mg Oral Q8H PRN Mojeed Akintayo   10 mg at 05/30/13 1427  . ondansetron (ZOFRAN) tablet 4 mg  4 mg Oral Q8H PRN Court Joy, PA-C      . terbinafine (LAMISIL) 1 % cream   Topical BID Court Joy, PA-C      . traZODone (DESYREL) tablet 200 mg  200 mg Oral QHS Mojeed Akintayo   200 mg at 05/30/13 2120  . ziprasidone (GEODON) injection 20 mg  20 mg Intramuscular Q12H PRN Thresa Ross, MD   20 mg at 05/24/13 1715    Lab Results:  No results found for this or any previous visit (from the past 48 hour(s)).  Physical Findings: AIMS: Facial and Oral Movements Muscles of Facial Expression: None, normal Lips and Perioral Area: None, normal Jaw: None, normal Tongue: None, normal,Extremity Movements Upper (arms, wrists, hands, fingers): Minimal Lower (legs, knees, ankles, toes): None, normal, Trunk Movements Neck, shoulders, hips: None, normal, Overall Severity Severity of abnormal movements (highest score from questions above): Minimal Incapacitation due to abnormal movements: None, normal Patient's awareness of  abnormal movements (rate only patient's report): No Awareness, Dental Status Current problems with teeth and/or dentures?: No Does patient usually wear dentures?: No  CIWA:    COWS:     Treatment Plan Summary: Daily contact with patient to assess and evaluate symptoms and progress in treatment Medication management  Plan:  Review of chart, vital signs, medications, and notes. 1-Individual and group therapy 2-Medication management :  A) Continue Depakote to 750 mg po BID for Bipolar,  B) Continue Haldol To 10 mg po TID for Psychosis,  C) Increase Trazodone to 200 mg po Qhs for complaint of insomnia. Will consider decrease of trazodone if still feeling sedated tomorrow along with changing Haldol to 10 mg QAM and 20 mg QHS. 3-Coping skills for depression, anxiety, and psychosis 4-Continue crisis stabilization and management 5-Address health issues-- Discontinue Naproxen 375 mg  6-Treatment plan in progress to prevent relapse of depression, psychosis, and anxiety 7- Patient received treatment for head lice and has been cleared by ID to be taken off  contact precautions.  8) Depakote level, Valproic acid level and ammonia level in AM. Medical Decision Making Problem Points:  Established problem, improving (1) and Review of psycho-social stressors (1), New problem with workup (1)  Data Points:  Review or order clinical lab tests (1) Review or order medicine tests (1) Review of medication regiment & side effects (2) Review of new medications or change in dosage (2)  I certify that inpatient services furnished can reasonably be expected to improve the patient's condition.   Jacqulyn Cane, MD 05/31/2013, 5:14 PM

## 2013-05-31 NOTE — Progress Notes (Signed)
Patient ID: [name] [name], male   DOB: 1961/01/30, 53 y.o.   MRN: [id number] Psychoeducational Group Note  Date:  05/31/2013 Time:  0930am  Group Topic/Focus:  Making Healthy Choices:   The focus of this group is to help patients identify negative/unhealthy choices they were using prior to admission and identify positive/healthier coping strategies to replace them upon discharge.  Participation Level:  Did Not Attend  Participation Quality:    Affect:  Cognitive:  Insight:   Engagement in Group: Additional Comments:  Self inventory group with rn  [name] [name], [name] [name] 05/31/2013,9:42 AM

## 2013-05-31 NOTE — Progress Notes (Signed)
Patient ID: [name] [name], male   DOB: 04-10-1961, 53 y.o.   MRN: [id number] 05-31-13 nursing shift note: D: pt has been in his room most of the am. He is not coming to groups and RN has administered his medications in his room. He stated that SI is constant, but is able to contract for the SI verbally. He denied any hi/av.  A: staff continues to support and encourage this patient. R: on his inventory sheet he wrote: slept poor, appetite improving, energy low, attention poor with his depression at 10 and hopelessness at 8. Patient did not complete his inventory sheet. RN will monitor and Q 15 min ck's continue.

## 2013-05-31 NOTE — Progress Notes (Signed)
D.  Pt. Has been sleeping and staying in his room most of the evening.     Flat affect, depressed mood.   A.  Encouraged pt. Lexicographerotify writer or staff of any concerns and to attend group. R.  Pt. Did not attend group but did come to the day room and sat down for  approximately 30 minutes and had a snack.

## 2013-05-31 NOTE — [facility] Group Notes (Signed)
[facility] LCSW Group Therapy Note   05/31/2013  11:15 To 11:50 AM   Type of Therapy and Topic: Group Therapy: Feelings Around Returning Home & Establishing a Supportive Framework and Activity to Identify signs of Improvement or Decompensation   Participation Level: Did not attend  Catherine C Harrill, LCSW  

## 2013-05-31 NOTE — [facility] Group Notes (Signed)
[facility] Group Notes:  (Nursing/MHT/Case Management/Adjunct)  Date:  05/31/2013  Time:  4:38 PM  Type of Therapy:  Psychoeducational Skills  Participation Level:  Did Not Attend  Participation Quality:  na  Affect:  na  Cognitive:  na  Insight:  None  Engagement in Group:  na  Modes of Intervention:  na  Summary of Progress/Problems:  Malva LimesStrader, Meggin Ola 05/31/2013, 4:38 PM

## 2013-05-31 NOTE — Progress Notes (Signed)
D: pt stayed in bed most of the day and evening. Pt stated he has passive si with no plan, but verbally contracts. Pt denies hi. Pt having some auditory hallucinations, but did not tell writer what is being said/heard. Denies pain. Pt is calm, and unwilling to participate in groups.  A: q 15 min safety checks R: pt remains safe on unit, no further complaints at this time

## 2013-05-31 NOTE — Progress Notes (Signed)
Psychoeducational Group Note  Date:  05/31/2013 Time:  2000  Group Topic/Focus:  Wrap-Up Group:   The focus of this group is to help patients review their daily goal of treatment and discuss progress on daily workbooks.  Participation Level: Did Not Attend  Participation Quality:  Not Applicable  Affect:  Not Applicable  Cognitive:  Not Applicable  Insight:  Not Applicable  Engagement in Group: Not Applicable  Additional Comments:  The patient did not attend group this evening since he was asleep in his bed.   Hazle CocaGOODMAN, Sula Fetterly S 05/31/2013, 10:15 PM

## 2013-06-01 DIAGNOSIS — F1210 Cannabis abuse, uncomplicated: Secondary | ICD-10-CM | POA: Diagnosis present

## 2013-06-01 LAB — HEPATIC FUNCTION PANEL
ALBUMIN: 3.4 g/dL — AB (ref 3.5–5.2)
ALK PHOS: 85 U/L (ref 39–117)
ALT: 24 U/L (ref 0–53)
AST: 22 U/L (ref 0–37)
Bilirubin, Direct: <0.2 mg/dL (ref 0.0–0.3)
Total Bilirubin: <0.2 mg/dL — ABNORMAL LOW (ref 0.3–1.2)
Total Protein: 6.5 g/dL (ref 6.0–8.3)

## 2013-06-01 LAB — AMMONIA: AMMONIA: 48 umol/L (ref 11–60)

## 2013-06-01 LAB — VALPROIC ACID LEVEL: VALPROIC ACID LVL: 41.4 ug/mL — AB (ref 50.0–100.0)

## 2013-06-01 MED ORDER — DIVALPROEX SODIUM ER 250 MG PO TB24: 750.0000 mg | ORAL_TABLET | Freq: Every day | ORAL | Status: DC

## 2013-06-01 MED ORDER — DIVALPROEX SODIUM ER 250 MG PO TB24
750.0000 mg | ORAL_TABLET | Freq: Every day | ORAL | Status: DC
Start: 2013-06-01 — End: 2013-06-01
  Filled 2013-06-01: qty 42

## 2013-06-01 MED ORDER — TRAZODONE HCL 100 MG PO TABS: 200.0000 mg | ORAL_TABLET | Freq: Every day | ORAL | Status: DC

## 2013-06-01 MED ORDER — HALOPERIDOL 10 MG PO TABS: 10.0000 mg | ORAL_TABLET | Freq: Three times a day (TID) | ORAL | Status: DC

## 2013-06-01 MED ORDER — BENZTROPINE MESYLATE 1 MG PO TABS: 1.0000 mg | ORAL_TABLET | Freq: Two times a day (BID) | ORAL | Status: DC

## 2013-06-01 NOTE — Progress Notes (Signed)
Adult Psychoeducational Group Note  Date:  06/01/2013 Time:  11:42 AM  Group Topic/Focus:  Wellness Toolbox:   The focus of this group is to discuss various aspects of wellness, balancing those aspects and exploring ways to increase the ability to experience wellness.  Patients will create a wellness toolbox for use upon discharge.  Participation Level:  Did Not Attend  Additional Comments:  Pt did not attend. Pt rested quietly in room.  Tonita CongMcLaurin, Yaneisy Wenz L 06/01/2013, 11:42 AM

## 2013-06-01 NOTE — Discharge Summary (Signed)
Physician Discharge Summary Note  Patient:  [name] [name] Dau is an 53 y.o., male MRN:  [id number] DOB:  April 16, 1961 Patient phone:  [phone] (home)  Patient address:   [address] [place] [address],   Date of Admission:  05/24/2013 Date of Discharge: 06/01/13  Reason for Admission:  Depression with SI, Psychosis   Discharge Diagnoses: Principal Problem:   Paranoid schizophrenia, chronic condition Active Problems:   Psychotic disorder   Bipolar disorder, unspecified  Review of Systems  Constitutional: Negative.   HENT: Negative.   Eyes: Negative.   Respiratory: Negative.   Cardiovascular: Negative.   Gastrointestinal: Negative.   Genitourinary: Negative.   Musculoskeletal: Negative.   Skin: Negative.   Neurological: Negative.   Endo/Heme/Allergies: Negative.     DSM5: AXIS I: Paranoid schizophrenia, chronic condition  Cannabis abuse  AXIS II: Cluster B Traits  AXIS III:  Past Medical History   Diagnosis  Date   .  Head lice    AXIS IV: economic problems, occupational problems and problems related to social environment  AXIS V: 61-70 mild symptoms  Level of Care:  OP  Hospital Course: [name] [name] is a 53 year old male who [facility] complaining of feeling suicidal for the last two weeks. Patient moved out of his boarding house to live on the streets for fear his feelings of agitation might lead him to hurt someone. In the ED he reported hearing voices telling him to kill himself by cutting his throat. Patient reported recent stressor that his girlfriend left him one week ago. Patient has been sleeping most of the day due to receiving IM geodon this morning due to acute agitation. Nursing staff reported that he was very agitated, labile, threatening staff and responding to internal stimuli. Upon waking up this afternoon the patient is much more pleasant and able to speak with this Clinical research associatewriter. Patient states "I wanted to kill myself. I am tired  of everything. I have been hearing voices. Been off medications since 2009. I have no support. Just been wandering around in the cold trying to survive." The patient is a poor historian and is unable to remember which medications he took in the past. Reviewed his discharge summary from 2009 when he was under the care [facility]. Per the summary the patient had a similar presentation at that time due to being off his medications. He was restarted on the medications that were listed on the discharge summary, which were documented as being effective for this patient.          [name] [name] Wallman was admitted to the adult unit. He was evaluated and his symptoms were identified. Medication management was discussed and initiated. The patient was initially started on Depakote 500 mg for mood instability and Zyprexa for psychosis. His antipsychotic was changed to Haldol due to patient report of the patient experiencing command hallucinations to hurt himself. He was oriented to the unit and encouraged to participate in unit programming. Medical problems were identified and treated appropriately. The patient on admission received treatment for lice and a fungal groin infection was treated with Lamisil cream. He was ruled out to have MRSA from a wound culture. Patient required an X-ray of his left hand as he reported having punched it into the wall from feeling very agitated. Home medication was restarted as needed.        The patient was evaluated each day by a clinical provider to ascertain the patient's response  to treatment. His Haldol was increased to 10 mg TID to address his report of continued auditory hallucinations.   Improvement was noted by the patient's report of decreasing symptoms, improved sleep and appetite, affect, medication tolerance, behavior, and participation in unit programming.  [name] Sheer was asked each day to complete a self inventory noting mood, mental status, pain, new symptoms,  anxiety and concerns.         He responded well to medication and being in a therapeutic and supportive environment. Positive and appropriate behavior was noted and the patient was motivated for recovery.  [name] Sheer worked closely with the treatment team and case manager to develop a discharge plan with appropriate goals. Coping skills, problem solving as well as relaxation therapies were also part of the unit programming.         By the day of discharge [name] [name] was in much improved condition than upon admission.  Symptoms were reported as significantly decreased or resolved completely.  The patient denied SI/HI and voiced no AVH. He was motivated to continue taking medication with a goal of continued improvement in mental health.          [name] Sheer was discharged home with a plan to follow up as noted below. On discharge the patient was given two week supply of medications, bus pass, and prescriptions. Patient planned on going to the homeless shelter located in downtown [place].    Consults:  Seen by PA to rule out MRSA infection on admission.   Significant Diagnostic Studies:  labs: Admission labs, ammonia and depakote level   Discharge Vitals:   Blood pressure 89/58, pulse 70, temperature 97.4 F (36.3 C), temperature source Oral, resp. rate 16, height 5\' 6"  (1.676 m), weight 72.122 kg (159 lb), SpO2 94.00%. Body mass index is 25.68 kg/(m^2). Lab Results:   Results for orders placed during the hospital encounter of 05/24/13 (from the past 72 hour(s))  HEPATIC FUNCTION PANEL     Status: Abnormal   Collection Time    06/01/13  6:20 AM      Result Value Range   Total Protein 6.5  6.0 - 8.3 g/dL   Albumin 3.4 (*) 3.5 - 5.2 g/dL   AST 22  0 - 37 U/[name]   ALT 24  0 - 53 U/[name]   Alkaline Phosphatase 85  39 - 117 U/[name]   Total Bilirubin <0.2 (*) 0.3 - 1.2 mg/dL   Bilirubin, Direct <0.9  0.0 - 0.3 mg/dL   Indirect Bilirubin NOT CALCULATED  0.3 - 0.9 mg/dL   Comment: Performed at  [facility]  AMMONIA     Status: None   Collection Time    06/01/13  6:20 AM      Result Value Range   Ammonia 48  11 - 60 umol/[name]   Comment: Performed at [facility]    Physical Findings: AIMS: Facial and Oral Movements Muscles of Facial Expression: None, normal Lips and Perioral Area: None, normal Jaw: None, normal Tongue: None, normal,Extremity Movements Upper (arms, wrists, hands, fingers): None, normal Lower (legs, knees, ankles, toes): None, normal, Trunk Movements Neck, shoulders, hips: None, normal, Overall Severity Severity of abnormal movements (highest score from questions above): None, normal Incapacitation due to abnormal movements: None, normal Patient's awareness of abnormal movements (rate only patient's report): No Awareness, Dental Status Current problems with teeth and/or dentures?: No Does patient usually wear dentures?: No  CIWA:    COWS:  Psychiatric Specialty Exam: See Psychiatric Specialty Exam and Suicide Risk Assessment completed by Attending Physician prior to discharge.  Discharge destination:  Home  Is patient on multiple antipsychotic therapies at discharge:  No   Has Patient had three or more failed trials of antipsychotic monotherapy by history:  No  Recommended Plan for Multiple Antipsychotic Therapies: NA     Medication List       Indication   benztropine 1 MG tablet  Commonly known as:  COGENTIN  Take 1 tablet (1 mg total) by mouth 2 (two) times daily.   Indication:  Extrapyramidal Reaction caused by Medications     divalproex 250 MG 24 hr tablet  Commonly known as:  DEPAKOTE ER  Take 3 tablets (750 mg total) by mouth at bedtime.   Indication:  Improved mood stability     haloperidol 10 MG tablet  Commonly known as:  HALDOL  Take 1 tablet (10 mg total) by mouth 3 (three) times daily. For voices.   Indication:  Schizophrenia, Severe Problems with Behavior     traZODone 100 MG tablet   Commonly known as:  DESYREL  Take 2 tablets (200 mg total) by mouth at bedtime.   Indication:  Trouble Sleeping           Follow-up Information   Follow up with [facility]. (Go to the walk-in clinic M-F between 8 and 9AM for your hospital follow up appointment)    Contact information:   [address]  [place]  [phone]      Follow-up recommendations:   Activity: as tolerated  Diet: healthy  Tests: Depakote level.  Other: Patient to keep his after care appointment   Comments:   Take all your medications as prescribed by your mental healthcare provider.  Report any adverse effects and or reactions from your medicines to your outpatient provider promptly.  Patient is instructed and cautioned to not engage in alcohol and or illegal drug use while on prescription medicines.  In the event of worsening symptoms, patient is instructed to call the crisis hotline, 911 and or go to the nearest ED for appropriate evaluation and treatment of symptoms.  Follow-up with your primary care provider for your other medical issues, concerns and or health care needs.   Total Discharge Time:  Greater than 30 minutes.  SignedFransisca Kaufmann NP-C 06/01/2013, 9:30 AM  Patient seen, evaluated and I agree with notes by Nurse Practitioner. Thedore Mins, MD

## 2013-06-01 NOTE — [facility] Suicide Risk Assessment (Signed)
Suicide Risk Assessment  Discharge Assessment     Demographic Factors:  Male, Caucasian, Low socioeconomic status, Unemployed and homeless  Mental Status Per Nursing Assessment::   On Admission:  Suicidal ideation indicated by patient;Suicide plan;Plan includes specific time, place, or method;Self-harm thoughts;Intention to act on suicide plan;Belief that plan would result in death;Thoughts of violence towards others  Current Mental Status by Physician: patient denies suicidal ideation, intent or plan  Loss Factors: Financial problems/change in socioeconomic status  Historical Factors: Impulsivity  Risk Reduction Factors:   Sense of responsibility to family  Continued Clinical Symptoms:  Alcohol/Substance Abuse/Dependencies  Cognitive Features That Contribute To Risk:  Polarized thinking    Suicide Risk:  Minimal: No identifiable suicidal ideation.  Patients presenting with no risk factors but with morbid ruminations; may be classified as minimal risk based on the severity of the depressive symptoms  Discharge Diagnoses:   AXIS I:  Paranoid schizophrenia, chronic condition              Cannabis abuse AXIS II:  Cluster B Traits AXIS III:   Past Medical History  Diagnosis Date  . Head lice    AXIS IV:  economic problems, occupational problems and problems related to social environment AXIS V:  61-70 mild symptoms  Plan Of Care/Follow-up recommendations:  Activity:  as tolerated Diet:  healthy Tests:  Depakote level. Other:  Patient to keep his after care appointment  Is patient on multiple antipsychotic therapies at discharge:  No   Has Patient had three or more failed trials of antipsychotic monotherapy by history:  No  Recommended Plan for Multiple Antipsychotic Therapies: NA  Thedore MinsAkintayo, Margarie Mcguirt, MD 06/01/2013, 10:06 AM

## 2013-06-01 NOTE — Tx Team (Signed)
  Interdisciplinary Treatment Plan Update   Date Reviewed:  06/01/2013  Time Reviewed:  10:29 AM  Progress in Treatment:   Attending groups: Yes Participating in groups: Yes Taking medication as prescribed: Yes  Tolerating medication: Yes Family/Significant other contact made: Yes  Patient understands diagnosis: Yes  Discussing patient identified problems/goals with staff: Yes Medical problems stabilized or resolved: Yes Denies suicidal/homicidal ideation: Yes Patient has not harmed self or others: Yes  For review of initial/current patient goals, please see plan of care.  Estimated Length of Stay:  D/C today  Reason for Continuation of Hospitalization:   New Problems/Goals identified:  N/A  Discharge Plan or Barriers:   [place], follow up [facility]  Additional Comments:  Attendees:  Signature: Thedore MinsMojeed Akintayo, MD 06/01/2013 10:29 AM   Signature: Richelle Itood Samanvi Cuccia, LCSW 06/01/2013 10:29 AM  Signature: Fransisca KaufmannLaura Severe, NP 06/01/2013 10:29 AM  Signature: Joslyn Devonaroline Beaudry, RN 06/01/2013 10:29 AM  Signature: Liborio NixonPatrice White, RN 06/01/2013 10:29 AM  Signature:  06/01/2013 10:29 AM  Signature:   06/01/2013 10:29 AM  Signature:    Signature:    Signature:    Signature:    Signature:    Signature:      Scribe for Treatment Team:   Richelle Itood Delaynee Alred, LCSW  06/01/2013 10:29 AM

## 2013-06-01 NOTE — Progress Notes (Signed)
D:  Patient discharged today.  All belongings retrieved from room and from locker in the search room.  [place]. A:  Reviewed all discharge instructions, medications, and follow up care utilizing the teach back method.  Patient provided with a two week supply of medications from the hospital pharmacy and a single use bus pass.  He was escorted off the unit and to the search room for his his belongings, then to the front lobby.   R:  Verbalized understanding of all discharge instructions.  Patient states he feels better.  He is denying suicidal thoughts or auditory hallucinations.  States he feels ready to leave the hospital.

## 2013-06-01 NOTE — Progress Notes (Signed)
[facility] Discharge [facility] :  Will you be returning to the same living situation after discharge: Yes,  [place] At discharge, do you have transportation home?:Yes,  bus Do you have the ability to pay for your medications:Yes,  mental health  Release of information consent forms completed and in the chart;  Patient's signature needed at discharge.  Patient to Follow up at: Follow-up Information   Follow up with [facility]. (Go to the walk-in clinic M-F between 8 and 9AM for your hospital follow up appointment)    Contact information:   [address]  [place]  [336] [phone]      Patient denies SI/HI:   Yes,  yes    Safety Planning and Suicide Prevention discussed:  Yes,  yes  [name], Calani Gick B 06/01/2013, 10:32 AM

## 2013-06-01 NOTE — Progress Notes (Signed)
Pt got up requesting something for sleep. Pt was informed it ws after 2:00 am, so pt asked for something to help his anxiety. Pt was given 10 mg Zyprexa at 0210 for anxiety.

## 2013-06-04 NOTE — Progress Notes (Signed)
Patient Discharge Instructions:  After Visit Summary (AVS):   Faxed to:  06/04/13 Discharge Summary Note:   Faxed to:  06/04/13 Psychiatric Admission Assessment Note:   Faxed to:  06/04/13 Suicide Risk Assessment - Discharge Assessment:   Faxed to:  06/04/13 Faxed/Sent to the Next Level Care provider:  06/04/13 [facility] to [facility] @ [phone]  Jerelene ReddenSheena E Juneau, 06/04/2013, 4:04 PM

## 2013-06-07 NOTE — ED Provider Notes (Signed)
Medical screening examination/treatment/procedure(s) were conducted as a shared visit with non-physician practitioner(s) and myself.  I personally evaluated the patient during the encounter.  EKG Interpretation   None       Pt with schizophrenia. Hearing voices, SI. Now alert,content, nad. Labs. Psych team eval.   Suzi RootsKevin E Keaston Pile, MD 06/07/13 1350

## 2013-06-11 ENCOUNTER — Encounter (HOSPITAL_COMMUNITY): Payer: Self-pay | Admitting: Emergency Medicine

## 2013-06-11 ENCOUNTER — Emergency Department (HOSPITAL_COMMUNITY)
Admission: EM | Admit: 2013-06-11 | Discharge: 2013-06-12 | Disposition: A | Discharge status: Home / Self Care | Payer: Self-pay | Attending: Emergency Medicine | Admitting: Emergency Medicine

## 2013-06-11 DIAGNOSIS — R45851 Suicidal ideations: Secondary | ICD-10-CM | POA: Insufficient documentation

## 2013-06-11 DIAGNOSIS — Z79899 Other long term (current) drug therapy: Secondary | ICD-10-CM | POA: Insufficient documentation

## 2013-06-11 DIAGNOSIS — F200 Paranoid schizophrenia: Secondary | ICD-10-CM | POA: Insufficient documentation

## 2013-06-11 DIAGNOSIS — F1210 Cannabis abuse, uncomplicated: Secondary | ICD-10-CM | POA: Insufficient documentation

## 2013-06-11 DIAGNOSIS — F17200 Nicotine dependence, unspecified, uncomplicated: Secondary | ICD-10-CM | POA: Insufficient documentation

## 2013-06-11 LAB — COMPREHENSIVE METABOLIC PANEL
ALBUMIN: 3.9 g/dL (ref 3.5–5.2)
ALT: 15 U/L (ref 0–53)
AST: 16 U/L (ref 0–37)
Alkaline Phosphatase: 88 U/L (ref 39–117)
BUN: 13 mg/dL (ref 6–23)
CO2: 25 mEq/L (ref 19–32)
Calcium: 9.1 mg/dL (ref 8.4–10.5)
Chloride: 105 mEq/L (ref 96–112)
Creatinine, Ser: 0.84 mg/dL (ref 0.50–1.35)
GFR calc non Af Amer: >90 mL/min (ref >90)
Glucose, Bld: 114 mg/dL — ABNORMAL HIGH (ref 70–99)
Potassium: 3.9 mEq/L (ref 3.7–5.3)
Sodium: 141 mEq/L (ref 137–147)
TOTAL PROTEIN: 7.5 g/dL (ref 6.0–8.3)
Total Bilirubin: <0.2 mg/dL — ABNORMAL LOW (ref 0.3–1.2)

## 2013-06-11 LAB — CBC
HEMATOCRIT: 39.2 % (ref 39.0–52.0)
Hemoglobin: 13.6 g/dL (ref 13.0–17.0)
MCH: 32.2 pg (ref 26.0–34.0)
MCHC: 34.7 g/dL (ref 30.0–36.0)
MCV: 92.7 fL (ref 78.0–100.0)
Platelets: 329 10*3/uL (ref 150–400)
RBC: 4.23 MIL/uL (ref 4.22–5.81)
RDW: 13.4 % (ref 11.5–15.5)
WBC: 9.2 10*3/uL (ref 4.0–10.5)

## 2013-06-11 LAB — RAPID URINE DRUG SCREEN, HOSP PERFORMED
AMPHETAMINES: NOT DETECTED
BENZODIAZEPINES: NOT DETECTED
Barbiturates: NOT DETECTED
Cocaine: NOT DETECTED
Opiates: NOT DETECTED
Tetrahydrocannabinol: POSITIVE — AB

## 2013-06-11 LAB — ETHANOL

## 2013-06-11 MED ORDER — ZOLPIDEM TARTRATE 5 MG PO TABS
5.0000 mg | ORAL_TABLET | Freq: Every evening | ORAL | Status: DC | PRN
  Administered 2013-06-11: 5 mg via ORAL

## 2013-06-11 MED ORDER — NICOTINE 21 MG/24HR TD PT24
21.0000 mg | MEDICATED_PATCH | Freq: Every day | TRANSDERMAL | Status: DC
  Administered 2013-06-11: 21 mg via TRANSDERMAL
  Filled 2013-06-11: qty 1

## 2013-06-11 MED ORDER — LORAZEPAM 1 MG PO TABS
1.0000 mg | ORAL_TABLET | Freq: Three times a day (TID) | ORAL | Status: DC | PRN
  Administered 2013-06-11: 1 mg via ORAL

## 2013-06-11 MED ORDER — IBUPROFEN 200 MG PO TABS: 600.0000 mg | ORAL_TABLET | Freq: Three times a day (TID) | ORAL | Status: DC | PRN

## 2013-06-11 MED ORDER — ONDANSETRON HCL 4 MG PO TABS: 4.0000 mg | ORAL_TABLET | Freq: Three times a day (TID) | ORAL | Status: DC | PRN

## 2013-06-11 NOTE — ED Notes (Signed)
Pt and belongings have been wanded 

## 2013-06-11 NOTE — ED Notes (Signed)
Pt states he has been having thoughts of suicide all day and states his anxiety level is out the roof  Pt states he "just cannot take it anymore"  Pt is very anxious and pacing in room   Pt brought in by EMS  Pt states he has not been taking his meds for the past 4 days because he said it gives him dry mouth and makes it hard to swallow

## 2013-06-11 NOTE — ED Provider Notes (Signed)
CSN: [id number]     Arrival date & time 06/11/13  2130 History  This chart was scribed for non-physician practitioner Fabio PiercePeter Damman, PA-C,working with Flint MelterElliott L Wentz, MD by Nicholos Johnsenise Iheanachor, ED scribe. This patient was seen in room WTR3/WLPT3 and the patient's care was started at 10:11 PM.  Chief Complaint  Patient presents with  . Medical Clearance   The history is provided by the patient. No language interpreter was used.   HPI Comments: [name] [name] is a 53 y.o. male w/hx of self mutilation presents to the Emergency Department for medical clearance. States he has been having thoughts of suicide, onset today. Says he is stressed but cannot pin point a specific event relating to current thoughts. Pt has been staying at a homeless shelter for the past week and a half. Says he is well taken care of at the shelter. States he has been taking his medication but doesn't feel like its working. Pt admits to purposely overdosing at age 53 in an attempt to kill himself. Denies any cold or flu symptoms.     Past Medical History  Diagnosis Date  . Paranoid schizophrenia    No past surgical history on file. No family history on file. History  Substance Use Topics  . Smoking status: Current Every Day Smoker -- 1.00 packs/day  . Smokeless tobacco: Never Used  . Alcohol Use: No   Review of Systems  Skin: Positive for wound.  Psychiatric/Behavioral: Positive for suicidal ideas.  All other systems reviewed and are negative.   Allergies  Review of patient's allergies indicates no known allergies.  Home Medications   Current Outpatient Rx  Name  Route  Sig  Dispense  Refill  . benztropine (COGENTIN) 1 MG tablet   Oral   Take 1 tablet (1 mg total) by mouth 2 (two) times daily.   60 tablet   0   . divalproex (DEPAKOTE ER) 250 MG 24 hr tablet   Oral   Take 3 tablets (750 mg total) by mouth at bedtime.   180 tablet   0   . haloperidol (HALDOL) 10 MG tablet   Oral   Take 1 tablet (10  mg total) by mouth 3 (three) times daily. For voices.   90 tablet   0   . traZODone (DESYREL) 100 MG tablet   Oral   Take 2 tablets (200 mg total) by mouth at bedtime.   60 tablet   0    Triage Vitals: BP 128/76  Pulse 90  Temp(Src) 97.9 F (36.6 C) (Oral)  Resp 17  SpO2 95%  Physical Exam  Nursing note and vitals reviewed. Constitutional: He is oriented to person, place, and time. He appears well-developed and well-nourished.  HENT:  Head: Normocephalic and atraumatic.  Eyes: Conjunctivae and EOM are normal.  Neck: Normal range of motion.  Cardiovascular: Normal rate, regular rhythm and normal heart sounds.   Pulmonary/Chest: Effort normal and breath sounds normal. No respiratory distress.  Musculoskeletal: Normal range of motion. He exhibits no tenderness.  Neurological: He is oriented to person, place, and time. He has normal reflexes.  Skin: Skin is warm and dry.  Psychiatric: He has a normal mood and affect. His behavior is normal. He expresses suicidal ideation.   ED Course  Procedures   DIAGNOSTIC STUDIES: Oxygen Saturation is 95% on room air, adequate by my interpretation.    COORDINATION OF CARE: At 10:16 PM: Discussed treatment plan with patient which includes speaking with someone from behavioral health.  Patient agrees.   TTS Consult placed.  Psych holding orders in place.   Results for orders placed during the hospital encounter of 06/11/13  CBC      Result Value Range   WBC 9.2  4.0 - 10.5 K/uL   RBC 4.23  4.22 - 5.81 MIL/uL   Hemoglobin 13.6  13.0 - 17.0 g/dL   HCT 16.1  09.6 - 04.5 %   MCV 92.7  78.0 - 100.0 fL   MCH 32.2  26.0 - 34.0 pg   MCHC 34.7  30.0 - 36.0 g/dL   RDW 40.9  81.1 - 91.4 %   Platelets 329  150 - 400 K/uL  COMPREHENSIVE METABOLIC PANEL      Result Value Range   Sodium 141  137 - 147 mEq/L   Potassium 3.9  3.7 - 5.3 mEq/L   Chloride 105  96 - 112 mEq/L   CO2 25  19 - 32 mEq/L   Glucose, Bld 114 (*) 70 - 99 mg/dL   BUN 13   6 - 23 mg/dL   Creatinine, Ser 7.82  0.50 - 1.35 mg/dL   Calcium 9.1  8.4 - 95.6 mg/dL   Total Protein 7.5  6.0 - 8.3 g/dL   Albumin 3.9  3.5 - 5.2 g/dL   AST 16  0 - 37 U/L   ALT 15  0 - 53 U/L   Alkaline Phosphatase 88  39 - 117 U/L   Total Bilirubin <0.2 (*) 0.3 - 1.2 mg/dL   GFR calc non Af Amer >90  >90 mL/min   GFR calc Af Amer >90  >90 mL/min  ETHANOL      Result Value Range   Alcohol, Ethyl (B) <11  0 - 11 mg/dL  URINE RAPID DRUG SCREEN (HOSP PERFORMED)      Result Value Range   Opiates NONE DETECTED  NONE DETECTED   Cocaine NONE DETECTED  NONE DETECTED   Benzodiazepines NONE DETECTED  NONE DETECTED   Amphetamines NONE DETECTED  NONE DETECTED   Tetrahydrocannabinol POSITIVE (*) NONE DETECTED   Barbiturates NONE DETECTED  NONE DETECTED     MDM   1. Suicidal ideation      I personally performed the services described in this documentation, which was scribed in my presence. The recorded information has been reviewed and is accurate.      Angus Seller, PA-C 06/12/13 [phone]

## 2013-06-11 NOTE — ED Notes (Signed)
Pt presents with c/o SI. Pt also c/o anxiety and PTSD. Per EMS, pt says that he "just wants it to end". Per EMS, pt also says that there has been a recent change to his meds.

## 2013-06-12 DIAGNOSIS — F200 Paranoid schizophrenia: Secondary | ICD-10-CM

## 2013-06-12 DIAGNOSIS — F1210 Cannabis abuse, uncomplicated: Secondary | ICD-10-CM

## 2013-06-12 NOTE — ED Notes (Signed)
Bus pass provided.  Patient states he wants to eat lunch then d/c.

## 2013-06-12 NOTE — ED Notes (Signed)
Currently denies SI/HI.  Requests bus pass for transportation.

## 2013-06-12 NOTE — Discharge Instructions (Signed)
Follow [facility] with [facility] today. Have called to make appointment or can consider walk in. Be compliant with recommendations.

## 2013-06-12 NOTE — Progress Notes (Signed)
Per discussion with psychiatrist and NP, patient psychiatrically stable for dc home. Pt plans to follow [facility] at [facility], at established appointment today.  Byrd HesselbachKristen Inanna Telford, LCSW [phone]  ED CSW 06/12/2013 11:50am
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
## 2013-06-12 NOTE — [facility] Suicide Risk Assessment (Signed)
Suicide Risk Assessment  Discharge Assessment     Demographic Factors:  Male, Low socioeconomic status and Unemployed  Total Time spent with patient: 30 minutes  Psychiatric Specialty Exam:     Blood pressure 120/76, pulse 68, temperature 97.5 F (36.4 C), temperature source Oral, resp. rate 16, SpO2 98.00%.There is no weight on file to calculate BMI.  General Appearance: Bizarre and Casual  Eye Contact::  Fair  Speech:  Slow  Volume:  Decreased  Mood:  dysphoric  Affect:  Constricted  Thought Process:  Loose  Orientation:  Full (Time, Place, and Person)  Thought Content:  Rumination  Suicidal Thoughts:  No  Homicidal Thoughts:  No  Memory:  Recent;   Fair  Judgement:  Poor  Insight:  poor  Psychomotor Activity:  Decreased  Concentration:  Fair  Recall:  Poor  Fund of Knowledge:Poor  Language: Fair  Akathisia:  Negative  Handed:  Right  AIMS (if indicated):     Assets:  Desire for Improvement Leisure Time  Sleep:       Musculoskeletal: Strength & Muscle Tone: within normal limits Gait & Station: normal Patient leans: Right   Mental Status Per Nursing Assessment::   On Admission:     Current Mental Status by Physician: see Mental status above  Loss Factors: Decrease in vocational status  Historical Factors: Prior suicide attempts  Risk Reduction Factors:   Positive therapeutic relationship  Continued Clinical Symptoms:  Dysthymia Schizophrenia:   Paranoid or undifferentiated type  Cognitive Features That Contribute To Risk:  Closed-mindedness Polarized thinking    Suicide Risk:  Minimal: No identifiable suicidal ideation.  Patients presenting with no risk factors but with morbid ruminations; may be classified as minimal risk based on the severity of the depressive symptoms  Discharge Diagnoses:   AXIS I:  Marijuana abuse AXIS II:  Cluster B Traits AXIS III:   Past Medical History  Diagnosis Date  . Paranoid schizophrenia    AXIS IV:   other psychosocial or environmental problems and problems with primary support group AXIS V:  51-60 moderate symptoms  Plan Of Care/Follow-up recommendations:  Activity:  as tolerated Diet:  regular Follow up with [facility]. Appointment being schedule by social services or can do walk-in. Not suicidal or threatening. Client can follow up with outpatient services and be compliant with medications. Is patient on multiple antipsychotic therapies at discharge:  No   Has Patient had three or more failed trials of antipsychotic monotherapy by history:  No  Recommended Plan for Multiple Antipsychotic Therapies: NA    Gilmore LarocheAKHTAR, Shaquayla Klimas MD 06/12/2013, 12:23 PM
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
## 2013-06-12 NOTE — Progress Notes (Signed)
[facility] provided pt with a [employer] Orange Card application, highlighting [facility].  °

## 2013-06-13 NOTE — ED Provider Notes (Signed)
Medical screening examination/treatment/procedure(s) were performed by non-physician practitioner and as supervising physician I was immediately available for consultation/collaboration.  Aeriel Boulay L Pavle Wiler, MD 06/13/13 0710 

## 2013-06-15 ENCOUNTER — Emergency Department (HOSPITAL_COMMUNITY)
Admission: EM | Admit: 2013-06-15 | Discharge: 2013-06-18 | Disposition: A | Discharge status: Other Acute Inpatient Hospital | Payer: Self-pay | Attending: Emergency Medicine | Admitting: Emergency Medicine

## 2013-06-15 ENCOUNTER — Encounter (HOSPITAL_COMMUNITY): Payer: Self-pay | Admitting: Emergency Medicine

## 2013-06-15 DIAGNOSIS — R45851 Suicidal ideations: Secondary | ICD-10-CM

## 2013-06-15 DIAGNOSIS — F200 Paranoid schizophrenia: Secondary | ICD-10-CM | POA: Insufficient documentation

## 2013-06-15 DIAGNOSIS — Z8659 Personal history of other mental and behavioral disorders: Secondary | ICD-10-CM

## 2013-06-15 DIAGNOSIS — Z79899 Other long term (current) drug therapy: Secondary | ICD-10-CM | POA: Insufficient documentation

## 2013-06-15 DIAGNOSIS — F17200 Nicotine dependence, unspecified, uncomplicated: Secondary | ICD-10-CM | POA: Insufficient documentation

## 2013-06-15 LAB — COMPREHENSIVE METABOLIC PANEL
ALT: 17 U/L (ref 0–53)
AST: 17 U/L (ref 0–37)
Albumin: 3.7 g/dL (ref 3.5–5.2)
Alkaline Phosphatase: 79 U/L (ref 39–117)
BUN: 15 mg/dL (ref 6–23)
CO2: 24 mEq/L (ref 19–32)
CREATININE: 0.84 mg/dL (ref 0.50–1.35)
Calcium: 8.9 mg/dL (ref 8.4–10.5)
Chloride: 104 mEq/L (ref 96–112)
GFR calc Af Amer: >90 mL/min (ref >90)
Glucose, Bld: 87 mg/dL (ref 70–99)
Potassium: 4.2 mEq/L (ref 3.7–5.3)
SODIUM: 140 meq/L (ref 137–147)
TOTAL PROTEIN: 7.1 g/dL (ref 6.0–8.3)
Total Bilirubin: <0.2 mg/dL — ABNORMAL LOW (ref 0.3–1.2)

## 2013-06-15 LAB — RAPID URINE DRUG SCREEN, HOSP PERFORMED
AMPHETAMINES: NOT DETECTED
Barbiturates: NOT DETECTED
Benzodiazepines: NOT DETECTED
COCAINE: NOT DETECTED
Opiates: NOT DETECTED
TETRAHYDROCANNABINOL: POSITIVE — AB

## 2013-06-15 LAB — ACETAMINOPHEN LEVEL: Acetaminophen (Tylenol), Serum: <15 ug/mL (ref 10–30)

## 2013-06-15 LAB — CBC
HCT: 39.8 % (ref 39.0–52.0)
Hemoglobin: 14 g/dL (ref 13.0–17.0)
MCH: 32.7 pg (ref 26.0–34.0)
MCHC: 35.2 g/dL (ref 30.0–36.0)
MCV: 93 fL (ref 78.0–100.0)
PLATELETS: 320 10*3/uL (ref 150–400)
RBC: 4.28 MIL/uL (ref 4.22–5.81)
RDW: 13.5 % (ref 11.5–15.5)
WBC: 9 10*3/uL (ref 4.0–10.5)

## 2013-06-15 LAB — SALICYLATE LEVEL: Salicylate Lvl: <2 mg/dL — ABNORMAL LOW (ref 2.8–20.0)

## 2013-06-15 LAB — ETHANOL: Alcohol, Ethyl (B): <11 mg/dL (ref 0–11)

## 2013-06-15 MED ORDER — TRAZODONE HCL 50 MG PO TABS
200.0000 mg | ORAL_TABLET | Freq: Every day | ORAL | Status: DC
  Administered 2013-06-15 – 2013-06-17 (×3): 200 mg via ORAL
  Filled 2013-06-15 (×4): qty 4

## 2013-06-15 MED ORDER — ALUM & MAG HYDROXIDE-SIMETH 200-200-20 MG/5ML PO SUSP: 30.0000 mL | ORAL | Status: DC | PRN

## 2013-06-15 MED ORDER — ONDANSETRON HCL 4 MG PO TABS: 4.0000 mg | ORAL_TABLET | Freq: Three times a day (TID) | ORAL | Status: DC | PRN

## 2013-06-15 MED ORDER — IBUPROFEN 400 MG PO TABS: 600.0000 mg | ORAL_TABLET | Freq: Three times a day (TID) | ORAL | Status: DC | PRN

## 2013-06-15 MED ORDER — HALOPERIDOL 5 MG PO TABS
10.0000 mg | ORAL_TABLET | Freq: Three times a day (TID) | ORAL | Status: DC
  Administered 2013-06-15 – 2013-06-16 (×2): 10 mg via ORAL
  Filled 2013-06-15 (×7): qty 2

## 2013-06-15 MED ORDER — ACETAMINOPHEN 325 MG PO TABS: 650.0000 mg | ORAL_TABLET | Freq: Four times a day (QID) | ORAL | Status: DC | PRN

## 2013-06-15 MED ORDER — DIVALPROEX SODIUM ER 500 MG PO TB24
750.0000 mg | ORAL_TABLET | Freq: Every day | ORAL | Status: DC
  Administered 2013-06-15 – 2013-06-17 (×3): 750 mg via ORAL
  Filled 2013-06-15 (×4): qty 1

## 2013-06-15 MED ORDER — ZOLPIDEM TARTRATE 5 MG PO TABS: 5.0000 mg | ORAL_TABLET | Freq: Every evening | ORAL | Status: DC | PRN

## 2013-06-15 MED ORDER — NICOTINE 21 MG/24HR TD PT24
21.0000 mg | MEDICATED_PATCH | Freq: Every day | TRANSDERMAL | Status: DC
Start: 2013-06-15 — End: 2013-06-18
  Administered 2013-06-15 – 2013-06-18 (×4): 21 mg via TRANSDERMAL
  Filled 2013-06-15 (×3): qty 1

## 2013-06-15 MED ORDER — BENZTROPINE MESYLATE 1 MG PO TABS
1.0000 mg | ORAL_TABLET | Freq: Two times a day (BID) | ORAL | Status: DC
  Administered 2013-06-15 – 2013-06-18 (×6): 1 mg via ORAL
  Filled 2013-06-15 (×6): qty 1

## 2013-06-15 NOTE — ED Notes (Signed)
Assumed care of pt from K. Cobb RN 

## 2013-06-15 NOTE — ED Notes (Signed)
Pt is here with thoughts of suicide and going to jump of bridge.  Pt reports hearing voices.  No attempt to hurt self yet.

## 2013-06-15 NOTE — Progress Notes (Signed)
Writer informed the nurse that the patient will receive an assessment at 12:40pm

## 2013-06-15 NOTE — Progress Notes (Signed)
[facility], [facility], and [facility] had bed availability. faxed all information over. Waiting for repsonse

## 2013-06-15 NOTE — BH Assessment (Signed)
Tele Assessment Note  [name] DakinsDavis [name]  is a 53 year old white male that reports SI with a plan to jump off a bridge.  Patient reports that the voices are telling him to jump off the bridge.  Patient reports that he was sitting on a bridge earlier today for 4 hours contemplating if he should jump.  Patient reports that he is not able to contract for safety.   Patient reports a history of mental illness associated with his diagnosis of (Paranoid Schizophrenia)   [facility] for [facility] and SI.  Patient reports prior suicide attempt at the age of 53 by overdose.  During the assessment the patient reports feelings of paranoia and racing thoughts. Patient reports that these symptoms started last night.   Patient denies HI.  Patient denies substance dependence.  Patient reports that he used marijuana on occasion.  Patient reports that his last use was on Saturday.    Patient reports that he is currently taking his medication of Cogentin, Depakote and Haldol.  Patient reports that the medication is not working because he is still able to hear the voices.       Axis I: Paranoid Schizophrenia Axis II: Deferred Axis III:  Past Medical History  Diagnosis Date  . Paranoid schizophrenia    Axis IV: economic problems, housing problems, occupational problems, other psychosocial or environmental problems, problems related to social environment and problems with primary support group Axis V: 31-40 impairment in reality testing  Past Medical History:  Past Medical History  Diagnosis Date  . Paranoid schizophrenia     History reviewed. No pertinent past surgical history.  Family History:  Family History  Problem Relation Age of Onset  . Diabetes Mother   . Hypertension Mother   . Cancer Father     Social History:  reports that he has been smoking.  He has never used smokeless tobacco. He reports that he uses illicit drugs (Marijuana). He reports that  he does not drink alcohol.  Additional Social History:     CIWA: CIWA-Ar BP: 114/76 mmHg Pulse Rate: 73 COWS:    Allergies: No Known Allergies  Home Medications:  (Not in a hospital admission)  OB/GYN Status:  No LMP for male patient.  General Assessment Data [facility] Is this a Tele or Face-to-Face Assessment?: Tele Assessment Is this an Initial Assessment or a Re-assessment for this encounter?: Initial Assessment Living Arrangements: Other (Comment) Can pt return to current living arrangement?: Yes Admission Status: Voluntary Is patient capable of signing voluntary admission?: Yes Transfer from: Acute Hospital Referral Source: Self/Family/Friend     [facility] Living Arrangements: Other (Comment)  Education Status Is patient currently in school?: No Current Grade: NA Highest grade of school patient has completed: NA Name of school: NA Contact person: NA  Risk to self Suicidal Ideation: Yes-Currently Present Suicidal Intent: Yes-Currently Present Is patient at risk for suicide?: Yes Suicidal Plan?: Yes-Currently Present Specify Current Suicidal Plan: Jump off a bridge. Access to Means: Yes Specify Access to Suicidal Means: Traffic What has been your use of drugs/alcohol within the last 12 months?: Marijuana Previous Attempts/Gestures: Yes How many times?:  (Mutiple Times) Other Self Harm Risks: History of cutting. Triggers for Past Attempts: Unpredictable;Hallucinations Intentional Self Injurious Behavior: Cutting Comment - Self Injurious Behavior: Past history Family Suicide History: No Recent stressful life event(s): Financial Problems;Other (Comment) (Homeless ) Persecutory voices/beliefs?: Yes Depression: Yes Depression Symptoms: Despondent;Fatigue;Loss of  interest in usual pleasures;Feeling worthless/self pity Substance abuse history and/or treatment for substance abuse?: No Suicide prevention information given to  non-admitted patients: Yes  Risk to Others Homicidal Ideation: No Thoughts of Harm to Others: No Current Homicidal Intent: No Current Homicidal Plan: No Access to Homicidal Means: No Identified Victim: None Reported  History of harm to others?: No Assessment of Violence: None Noted Violent Behavior Description: N/A Does patient have access to weapons?: No Criminal Charges Pending?: No Describe Pending Criminal Charges: NA Does patient have a court date: No  Psychosis Hallucinations: Auditory Delusions: None noted  Mental Status Report Appear/Hygiene: Disheveled Eye Contact: Fair Motor Activity: Freedom of movement Speech: Logical/coherent Level of Consciousness: Quiet/awake Mood: Depressed Affect: Blunted Anxiety Level: Moderate Thought Processes: Coherent;Relevant Judgement: Unimpaired Orientation: Place;Person;Time;Situation Obsessive Compulsive Thoughts/Behaviors: None  Cognitive Functioning Concentration: Decreased Memory: Recent Intact;Remote Intact IQ: Average Insight: Fair Impulse Control: Fair Appetite: Fair Weight Loss: 0 Weight Gain: 0 Sleep: Decreased Total Hours of Sleep: 4 Vegetative Symptoms: None  ADLScreening [facility] Assessment Services) Patient's cognitive ability adequate to safely complete daily activities?: Yes Patient able to express need for assistance with ADLs?: Yes Independently performs ADLs?: Yes (appropriate for developmental age)  Prior Inpatient Therapy Prior Inpatient Therapy: Yes Prior Therapy Dates: 2009 Prior Therapy Facilty/Provider(s): [facility] Reason for Treatment: SI  Prior Outpatient Therapy Prior Outpatient Therapy: No Prior Therapy Dates: NA Prior Therapy Facilty/Provider(s): NA Reason for Treatment: NA  ADL Screening (condition at time of admission) Patient's cognitive ability adequate to safely complete daily activities?: Yes Patient able to express need for assistance with ADLs?: Yes Independently performs ADLs?:  Yes (appropriate for developmental age)                  Additional Information 1:1 In Past 12 Months?: No CIRT Risk: No Elopement Risk: No Does patient have medical clearance?: Yes     Disposition: Accepted by Alessandra Grout, NP.  No beds at [facility].  [facility] Dispo Tech has referred the patient to [facility].                         On Site Evaluation by:   Reviewed with Physician:    Phillip Heal LaVerne 06/15/2013 1:11 PM

## 2013-06-15 NOTE — ED Provider Notes (Signed)
TIME SEEN: 10:17 AM  CHIEF COMPLAINT: SI, command hallucinations  HPI: Pt is a 53 y.o. M history of self mutilation, with schizophrenia, prior suicide attempt at age 71 by overdose who presents emergency department with racing thoughts, command hallucinations, suicidal ideation with plan to jump off of a bridge. He reports these symptoms started last night. He is also hearing voices telling him to kill himself. Denies any thoughts of going to hurt anyone else. No visual hallucinations. He states he is currently taking his medication of Cogentin, Depakote and Haldol. He denies any alcohol but states he does use marijuana occasionally. He reports he was living in a shelter but has not been staying there for the past several nights. He states he sleeps "anywhere I can". Denies any current medical complaints.  ROS: See HPI Constitutional: no fever  Eyes: no drainage  ENT: no runny nose   Cardiovascular:  no chest pain  Resp: no SOB  GI: no vomiting GU: no dysuria Integumentary: no rash  Allergy: no hives  Musculoskeletal: no leg swelling  Neurological: no slurred speech ROS otherwise negative  PAST MEDICAL HISTORY/PAST SURGICAL HISTORY:  Past Medical History  Diagnosis Date  . Paranoid schizophrenia     MEDICATIONS:  Prior to Admission medications   Medication Sig Start Date End Date Taking? Authorizing Provider  benztropine (COGENTIN) 1 MG tablet Take 1 tablet (1 mg total) by mouth 2 (two) times daily. 06/01/13   Fransisca Kaufmann, NP  divalproex (DEPAKOTE ER) 250 MG 24 hr tablet Take 3 tablets (750 mg total) by mouth at bedtime. 06/01/13   Fransisca Kaufmann, NP  haloperidol (HALDOL) 10 MG tablet Take 1 tablet (10 mg total) by mouth 3 (three) times daily. For voices. 06/01/13   Fransisca Kaufmann, NP  traZODone (DESYREL) 100 MG tablet Take 2 tablets (200 mg total) by mouth at bedtime. 06/01/13   Fransisca Kaufmann, NP    ALLERGIES:  No Known Allergies  SOCIAL HISTORY:  History  Substance Use Topics  .  Smoking status: Current Every Day Smoker -- 1.00 packs/day  . Smokeless tobacco: Never Used  . Alcohol Use: No    FAMILY HISTORY: Family History  Problem Relation Age of Onset  . Diabetes Mother   . Hypertension Mother   . Cancer Father     EXAM: BP 114/76  Pulse 73  Temp(Src) 97.9 F (36.6 C) (Oral)  Resp 20  Ht 5\' 7"  (1.702 m)  Wt 165 lb (74.844 kg)  BMI 25.84 kg/m2  SpO2 95% CONSTITUTIONAL: Alert and oriented and responds appropriately to questions. Well-appearing; well-nourished HEAD: Normocephalic EYES: Conjunctivae clear, PERRL ENT: normal nose; no rhinorrhea; moist mucous membranes; pharynx without lesions noted NECK: Supple, no meningismus, no LAD  CARD: RRR; S1 and S2 appreciated; no murmurs, no clicks, no rubs, no gallops RESP: Normal chest excursion without splinting or tachypnea; breath sounds clear and equal bilaterally; no wheezes, no rhonchi, no rales,  ABD/GI: Normal bowel sounds; non-distended; soft, non-tender, no rebound, no guarding BACK:  The back appears normal and is non-tender to palpation, there is no CVA tenderness EXT: Normal ROM in all joints; non-tender to palpation; no edema; normal capillary refill; no cyanosis    SKIN: Normal color for age and race; warm; multiple scars to bilateral forearms from prior self mutilation NEURO: Moves all extremities equally PSYCH: Patient here with suicidal ideation with plan to jump off bridge. He also endorses command hallucinations.  MEDICAL DECISION MAKING: Patient here with SI, command hallucinations. Patient reports that he  plans to jump off a bridge. He is not able to contract for safety at this time. No current medical complaints, exam benign, vital signs within normal limits. We'll obtain screening labs, urine. [facility]  ED PROGRESS: Patient's labs are unremarkable. He is currently medically cleared. Awaiting psychiatry disposition.   2:12 PM  Pt [facility] by [facility].  [facility]   [facility].     Layla MawKristen N Ward, DO 06/15/13 1413

## 2013-06-16 LAB — CBC
HCT: 41.8 % (ref 39.0–52.0)
HEMOGLOBIN: 14.8 g/dL (ref 13.0–17.0)
MCH: 32.8 pg (ref 26.0–34.0)
MCHC: 35.4 g/dL (ref 30.0–36.0)
MCV: 92.7 fL (ref 78.0–100.0)
PLATELETS: ADEQUATE 10*3/uL (ref 150–400)
RBC: 4.51 MIL/uL (ref 4.22–5.81)
RDW: 13.2 % (ref 11.5–15.5)
WBC: 8.1 10*3/uL (ref 4.0–10.5)

## 2013-06-16 LAB — BASIC METABOLIC PANEL
BUN: 14 mg/dL (ref 6–23)
CO2: 22 meq/L (ref 19–32)
CREATININE: 0.81 mg/dL (ref 0.50–1.35)
Calcium: 8.8 mg/dL (ref 8.4–10.5)
Chloride: 106 mEq/L (ref 96–112)
GFR calc Af Amer: >90 mL/min (ref >90)
GLUCOSE: 106 mg/dL — AB (ref 70–99)
Potassium: 4.4 mEq/L (ref 3.7–5.3)
SODIUM: 140 meq/L (ref 137–147)

## 2013-06-16 LAB — CG4 I-STAT (LACTIC ACID): Lactic Acid, Venous: 1.12 mmol/L (ref 0.5–2.2)

## 2013-06-16 MED ORDER — SODIUM CHLORIDE 0.9 % IV BOLUS (SEPSIS)
1000.0000 mL | Freq: Once | INTRAVENOUS | Status: AC
  Administered 2013-06-16: 1000 mL via INTRAVENOUS

## 2013-06-16 NOTE — ED Provider Notes (Signed)
Nursing informed me patient is hypotensive. He is walking around, doing well. He does states some occasional dizziness. Patient here with normal lactate, normal repeat labs. Orthostatics increase at he started standing. On personal eval, relaxing, doing well, tolerating PO, eating. Pressures better.  1. Suicidal ideation   2. History of command hallucinations      Dagmar HaitWilliam Rual Vermeer, MD 06/16/13 1534

## 2013-06-16 NOTE — ED Notes (Signed)
Lactic acid results shown to Dr. Walden 

## 2013-06-17 MED ORDER — PERMETHRIN 0.25 % LIQD: Freq: Once | Status: DC

## 2013-06-17 MED ORDER — PERMETHRIN 1 % EX LOTN
TOPICAL_LOTION | Freq: Once | CUTANEOUS | Status: AC
  Administered 2013-06-17: 1 via TOPICAL
  Filled 2013-06-17: qty 59

## 2013-06-17 NOTE — Progress Notes (Signed)
[facility]  The accepting doctor Jonnalagadda.  The nurse will arrange transportation through [facility].  The nurse will [facility] the [facility].  Writer informed the ER MD (Dr. Oletta CohnPolina) of the patients disposition.

## 2013-06-17 NOTE — ED Provider Notes (Signed)
Patient resting comfortably this morning with no overnight complaints/problems, per nursing staff. Continue to await placement.  Filed Vitals:   06/17/13 0607  BP: 103/62  Pulse: 62  Temp: 98 F (36.7 C)  Resp: 20     Gilda Creasehristopher J. Baylynn Shifflett, MD 06/17/13 [phone]

## 2013-06-17 NOTE — Progress Notes (Signed)
Placed follow-up calls to the following:  [facility]- spoke with Annabelle Harmanana stated they are currently at capacity but could check back later for possible d/c's and then re-fax [facility]- spoke with Gigi GinPeggy, stated they could not find referral but requested a call back later while they continued to look for it.  Will follow-up    [facility] [place] [facility]

## 2013-06-17 NOTE — ED Notes (Signed)
Pt states "just the voices are bothering him."  He states they are still saying the same things and nothing else new since arrival.

## 2013-06-18 ENCOUNTER — Inpatient Hospital Stay (HOSPITAL_COMMUNITY)
Admission: AD | Admit: 2013-06-18 | Discharge: 2013-06-25 | DRG: 885 | Disposition: A | Discharge status: Home / Self Care | Payer: No Typology Code available for payment source | Source: Intra-hospital | Attending: Psychiatry | Admitting: Psychiatry

## 2013-06-18 ENCOUNTER — Encounter (HOSPITAL_COMMUNITY): Payer: Self-pay

## 2013-06-18 DIAGNOSIS — Z9119 Patient's noncompliance with other medical treatment and regimen: Secondary | ICD-10-CM

## 2013-06-18 DIAGNOSIS — Z5987 Material hardship due to limited financial resources, not elsewhere classified: Secondary | ICD-10-CM

## 2013-06-18 DIAGNOSIS — F1210 Cannabis abuse, uncomplicated: Secondary | ICD-10-CM | POA: Diagnosis present

## 2013-06-18 DIAGNOSIS — F411 Generalized anxiety disorder: Secondary | ICD-10-CM | POA: Diagnosis present

## 2013-06-18 DIAGNOSIS — IMO0001 Reserved for inherently not codable concepts without codable children: Secondary | ICD-10-CM | POA: Diagnosis present

## 2013-06-18 DIAGNOSIS — Z5900 Homelessness unspecified: Secondary | ICD-10-CM

## 2013-06-18 DIAGNOSIS — Z598 Other problems related to housing and economic circumstances: Secondary | ICD-10-CM

## 2013-06-18 DIAGNOSIS — Z8249 Family history of ischemic heart disease and other diseases of the circulatory system: Secondary | ICD-10-CM

## 2013-06-18 DIAGNOSIS — G4700 Insomnia, unspecified: Secondary | ICD-10-CM | POA: Diagnosis present

## 2013-06-18 DIAGNOSIS — F29 Unspecified psychosis not due to a substance or known physiological condition: Secondary | ICD-10-CM

## 2013-06-18 DIAGNOSIS — IMO0002 Reserved for concepts with insufficient information to code with codable children: Secondary | ICD-10-CM | POA: Diagnosis present

## 2013-06-18 DIAGNOSIS — F17200 Nicotine dependence, unspecified, uncomplicated: Secondary | ICD-10-CM | POA: Diagnosis present

## 2013-06-18 DIAGNOSIS — Z833 Family history of diabetes mellitus: Secondary | ICD-10-CM

## 2013-06-18 DIAGNOSIS — R45851 Suicidal ideations: Secondary | ICD-10-CM

## 2013-06-18 DIAGNOSIS — F200 Paranoid schizophrenia: Principal | ICD-10-CM | POA: Diagnosis present

## 2013-06-18 DIAGNOSIS — F1994 Other psychoactive substance use, unspecified with psychoactive substance-induced mood disorder: Secondary | ICD-10-CM | POA: Diagnosis present

## 2013-06-18 DIAGNOSIS — Z91199 Patient's noncompliance with other medical treatment and regimen due to unspecified reason: Secondary | ICD-10-CM

## 2013-06-18 DIAGNOSIS — F333 Major depressive disorder, recurrent, severe with psychotic symptoms: Secondary | ICD-10-CM | POA: Diagnosis present

## 2013-06-18 DIAGNOSIS — F319 Bipolar disorder, unspecified: Secondary | ICD-10-CM

## 2013-06-18 DIAGNOSIS — F39 Unspecified mood [affective] disorder: Secondary | ICD-10-CM | POA: Diagnosis present

## 2013-06-18 LAB — GLUCOSE, CAPILLARY: Glucose-Capillary: 103 mg/dL — ABNORMAL HIGH (ref 70–99)

## 2013-06-18 MED ORDER — TRAZODONE HCL 100 MG PO TABS
100.0000 mg | ORAL_TABLET | Freq: Every evening | ORAL | Status: DC | PRN
  Administered 2013-06-18 – 2013-06-20 (×3): 100 mg via ORAL
  Filled 2013-06-18 (×11): qty 1

## 2013-06-18 MED ORDER — HALOPERIDOL 5 MG PO TABS
10.0000 mg | ORAL_TABLET | Freq: Once | ORAL | Status: DC
  Filled 2013-06-18 (×2): qty 2

## 2013-06-18 MED ORDER — ACETAMINOPHEN 325 MG PO TABS: 650.0000 mg | ORAL_TABLET | Freq: Four times a day (QID) | ORAL | Status: DC | PRN

## 2013-06-18 MED ORDER — ALUM & MAG HYDROXIDE-SIMETH 200-200-20 MG/5ML PO SUSP: 30.0000 mL | ORAL | Status: DC | PRN

## 2013-06-18 MED ORDER — MAGNESIUM HYDROXIDE 400 MG/5ML PO SUSP: 30.0000 mL | Freq: Every day | ORAL | Status: DC | PRN

## 2013-06-18 MED ORDER — BENZTROPINE MESYLATE 1 MG PO TABS
1.0000 mg | ORAL_TABLET | Freq: Two times a day (BID) | ORAL | Status: DC
  Administered 2013-06-19 – 2013-06-25 (×14): 1 mg via ORAL
  Filled 2013-06-18 (×17): qty 1

## 2013-06-18 NOTE — ED Notes (Addendum)
Pt.wanted to use restroom  He sat on the side of bed.than stood up  the patient complains dizzy.let pt.use  A urinal.. to void in. assit pt. Back in bed.report to nurse.

## 2013-06-18 NOTE — ED Notes (Signed)
Report given to [employer] Omaha[employer]ina G.  Pt to recheck hair at 5:30 for nichs.  Clothing placed in red bag and secured for transfer.  [facility] change.

## 2013-06-18 NOTE — ED Notes (Signed)
Pt combed hair very well, no lice noted on comb. Pt ready for transport.

## 2013-06-18 NOTE — Progress Notes (Signed)
B.Sayward Horvath, MHT confirmed that patient still has bed assigned. Writer informed attending nurse will arrange for transfer via [facility].

## 2013-06-18 NOTE — ED Notes (Signed)
Report to nurse about pt.vatialsigns

## 2013-06-18 NOTE — ED Notes (Signed)
[facility] notified  

## 2013-06-18 NOTE — Tx Team (Signed)
Initial Interdisciplinary Treatment Plan  PATIENT STRENGTHS: (choose at least two) Ability for insight Motivation for treatment/growth  PATIENT STRESSORS: Financial difficulties Medication change or noncompliance   PROBLEM LIST: Problem List/Patient Goals Date to be addressed Date deferred Reason deferred Estimated date of resolution  Depression 06/18/13     Anxiety 06/18/13                                                DISCHARGE CRITERIA:  Improved stabilization in mood, thinking, and/or behavior Motivation to continue treatment in a less acute level of care Need for constant or close observation no longer present Verbal commitment to aftercare and medication compliance  PRELIMINARY DISCHARGE PLAN: Return to previous living arrangements  PATIENT/FAMIILY INVOLVEMENT: This treatment plan has been presented to and reviewed with the patient, London SheerFleming L Chalupa, and/or family member,  The patient and family have been given the opportunity to ask questions and make suggestions.  Vir Whetstine Dawkins 06/18/2013, 8:10 PM

## 2013-06-18 NOTE — ED Notes (Signed)
Per Barrington EllisonEric Kapplan pt belongs need to be placed in a sealed bag with date and time.  Pt must comb hair at 1730 and then [facility]  Pt belongs must be placed in a sealed bag with date and time (2 weeks before pt can open to kill nichs).  Thurman CoyerEric Kaplan [phone]

## 2013-06-18 NOTE — Progress Notes (Signed)
Voluntary admission for a 53 y.o. Male with flat affect, depressed mood.  Pt. Reports that he has been depressed for years but has had increasing depression for the last 1 1/2 weeks and thoughts of jumping off a bridge.  Reports that he is  homeless and lives in a shelter.  Pt. Has been noncompliant with medication.  Denies HI but verbalizes passive SI in which he Contracted with Clinical research associatewriter for safety.  Verbalizes use of Marijuana 1 - 2 times weekly.  Reports intermittently hearing garbled voices.  Denies visual hallucinations.  Pt. Oriented to unit.

## 2013-06-18 NOTE — ED Notes (Signed)
Pt reassessed, denies feeling light headed, no nausea, no vomiting.

## 2013-06-18 NOTE — Progress Notes (Signed)
B.Mahad Newstrom, MHT completed support paper work with patient and [facility]. Writer [facility] of patients recent history of head lice. Clydie BraunKaren, RN attending will double bag personal clothing please alert and advise any other personal having direct contact with this patient. Clydie BraunKaren, RN will arrange for transfer via [facility] around 10 am this morning.

## 2013-06-19 DIAGNOSIS — F319 Bipolar disorder, unspecified: Secondary | ICD-10-CM

## 2013-06-19 MED ORDER — NICOTINE 21 MG/24HR TD PT24
MEDICATED_PATCH | TRANSDERMAL | Status: AC
  Filled 2013-06-19: qty 1

## 2013-06-19 MED ORDER — RISPERIDONE 1 MG PO TABS
1.0000 mg | ORAL_TABLET | Freq: Two times a day (BID) | ORAL | Status: DC
  Administered 2013-06-19 – 2013-06-22 (×6): 1 mg via ORAL
  Filled 2013-06-19 (×10): qty 1

## 2013-06-19 MED ORDER — DIVALPROEX SODIUM ER 500 MG PO TB24
750.0000 mg | ORAL_TABLET | Freq: Every day | ORAL | Status: DC
  Administered 2013-06-19 – 2013-06-20 (×2): 750 mg via ORAL
  Filled 2013-06-19 (×4): qty 1

## 2013-06-19 MED ORDER — NICOTINE 21 MG/24HR TD PT24
21.0000 mg | MEDICATED_PATCH | Freq: Every day | TRANSDERMAL | Status: DC
Start: 2013-06-19 — End: 2013-06-25
  Administered 2013-06-19 – 2013-06-25 (×7): 21 mg via TRANSDERMAL
  Filled 2013-06-19 (×7): qty 1

## 2013-06-19 NOTE — H&P (Signed)
Psychiatric Admission Assessment Adult  Patient Identification:  [name] [name] Date of Evaluation:  06/19/2013 Chief Complaint:  Schizophrenia History of Present Illness: [name] [name] is a 53 year old male who was admitted voluntarily and emergently [facility] with the depression and feeling suicidal  ideation with a plan of jumping out of the bridge. Patient  reported that he has been homeless and has been living in [facility] and has been noncompliant with the medication because his medication is making him feel not good. Patient reported he continued to have a paranoid thoughts and suicidal ideation. Patient also reported hearing voices telling him to kill himself. Patient has been very agitated, labile and responding to internal stimuli. Patient states "I wanted to kill myself. I am tired of everything. Patient Has Been Taking His Medication since 2900 and Medications Don't Don't Work Any Longer for Him. Patient does report has been smoking marijuana. He have no support. The patient is a poor historian and is unable to remember which medications he took in the past.   Elements:  Location:  Depression and  psychosis. Quality:  Acute. Severity:  Suicidal ideation or plan. Timing:  Medication not working. Duration:  2 weeks. Context:  Homelessness and lack of support. Associated Signs/Synptoms: Depression Symptoms:  depressed mood, anhedonia, insomnia, psychomotor agitation, fatigue, hopelessness, impaired memory, suicidal thoughts with specific plan, disturbed sleep, decreased labido, decreased appetite, (Hypo) Manic Symptoms:  Distractibility, Impulsivity, Irritable Mood, Anxiety Symptoms:  Excessive Worry, Psychotic Symptoms:  Hallucinations: Auditory Paranoia, PTSD Symptoms: NA Total Time spent with patient: 45 minutes  Psychiatric Specialty Exam: Physical Exam  Constitutional: He is oriented to person, place, and time. He appears well-developed.  HENT:  Head:  Normocephalic.  Eyes: Pupils are equal, round, and reactive to light.  Neck: Normal range of motion.  Cardiovascular: Normal rate.   Respiratory: Effort normal.  GI: Soft.  Musculoskeletal: Normal range of motion.  Neurological: He is alert and oriented to person, place, and time.  Skin: Skin is warm.    Review of Systems  Psychiatric/Behavioral: Positive for depression, suicidal ideas, hallucinations and substance abuse. The patient is nervous/anxious and has insomnia.   All other systems reviewed and are negative.    Blood pressure 99/61, pulse 86, temperature 97.6 F (36.4 C), temperature source Oral, resp. rate 16, height _0  (1.702 m), weight 73.029 kg (161 lb), SpO2 96.00%.Body mass index is 25.21 kg/(m^2).  General Appearance: Bizarre, Disheveled and Guarded  Eye Contact::  Minimal  Speech:  Clear and Coherent  Volume:  Decreased  Mood:  Anxious and Irritable  Affect:  Congruent and Depressed  Thought Process:  Goal Directed and Intact  Orientation:  Full (Time, Place, and Person)  Thought Content:  Hallucinations: Auditory and Rumination  Suicidal Thoughts:  Yes.  with intent/plan  Homicidal Thoughts:  No  Memory:  Immediate;   Fair  Judgement:  Impaired  Insight:  Lacking  Psychomotor Activity:  Psychomotor Retardation  Concentration:  Fair  Recall:  [place]  Language: Fair  Akathisia:  No  Handed:  Right  AIMS (if indicated):     Assets:  Communication Skills Desire for Improvement Leisure Time Physical Health Resilience  Sleep:  Number of Hours: 5.75    Musculoskeletal: Strength & Muscle Tone: within normal limits Gait & Station: normal Patient leans: N/A  Past Psychiatric History: Diagnosis: Schizophrenia paranoid   Hospitalizations: Multiple acute psychiatric hospitalization   Outpatient Care: [facility]   Substance Abuse Care: Marijuana  Self-Mutilation: No   Suicidal Attempts: Yes   Violent Behaviors: No    Past Medical  History:   Past Medical History  Diagnosis Date  . Paranoid schizophrenia    None. Allergies:  No Known Allergies PTA Medications: Prescriptions prior to admission  Medication Sig Dispense Refill  . benztropine (COGENTIN) 1 MG tablet Take 1 tablet (1 mg total) by mouth 2 (two) times daily.  60 tablet  0  . divalproex (DEPAKOTE ER) 250 MG 24 hr tablet Take 3 tablets (750 mg total) by mouth at bedtime.  180 tablet  0  . haloperidol (HALDOL) 10 MG tablet Take 1 tablet (10 mg total) by mouth 3 (three) times daily. For voices.  90 tablet  0  . traZODone (DESYREL) 100 MG tablet Take 2 tablets (200 mg total) by mouth at bedtime.  60 tablet  0    Previous Psychotropic Medications:  Medication/Dose  Haldol   Geodon   Depakote   Cogentin          Substance Abuse History in the last 12 months:  yes  Consequences of Substance Abuse: NA  Social History:  reports that he has been smoking Cigarettes.  He has a 60 pack-year smoking history. He has never used smokeless tobacco. He reports that he uses illicit drugs (Marijuana). He reports that he does not drink alcohol. Additional Social History: Pain Medications: see PTA Prescriptions: see PTA Over the Counter: n/a History of alcohol / drug use?: Yes Name of Substance 1: Marijuana 1 - Age of First Use: teens 1 - Amount (size/oz): varies 1 - Frequency: 1-2 times per week 1 - Duration: on going 1 - Last Use / Amount: 06/12/13                  Current Place of Residence:   Place of Birth:   Family Members: Marital Status:  Single Children:  Sons:  Daughters: Relationships: Education:  HS Soil scientist Problems/Performance: Religious Beliefs/Practices: History of Abuse (Emotional/Phsycial/Sexual) Occupational Experiences; Military History:  None. Legal History: Hobbies/Interests:  Family History:   Family History  Problem Relation Age of Onset  . Diabetes Mother   . Hypertension Mother   . Cancer Father      Results for orders placed during the hospital encounter of 06/15/13 (from the past 72 hour(s))  CBC     Status: None   Collection Time    06/16/13  1:20 PM      Result Value Ref Range   WBC 8.1  4.0 - 10.5 K/uL   Comment: WHITE COUNT CONFIRMED ON SMEAR   RBC 4.51  4.22 - 5.81 MIL/uL   Hemoglobin 14.8  13.0 - 17.0 g/dL   HCT 41.8  39.0 - 52.0 %   MCV 92.7  78.0 - 100.0 fL   MCH 32.8  26.0 - 34.0 pg   MCHC 35.4  30.0 - 36.0 g/dL   RDW 13.2  11.5 - 15.5 %   Platelets    150 - 400 K/uL   Value: PLATELET CLUMPS NOTED ON SMEAR, COUNT APPEARS ADEQUATE  BASIC METABOLIC PANEL     Status: Abnormal   Collection Time    06/16/13  1:20 PM      Result Value Ref Range   Sodium 140  137 - 147 mEq/L   Potassium 4.4  3.7 - 5.3 mEq/L   Chloride 106  96 - 112 mEq/L   CO2 22  19 - 32 mEq/L   Glucose, Bld 106 (*) 70 -  99 mg/dL   BUN 14  6 - 23 mg/dL   Creatinine, Ser 0.81  0.50 - 1.35 mg/dL   Calcium 8.8  8.4 - 10.5 mg/dL   GFR calc non Af Amer >90  >90 mL/min   GFR calc Af Amer >90  >90 mL/min   Comment: (NOTE)     The eGFR has been calculated using the CKD EPI equation.     This calculation has not been validated in all clinical situations.     eGFR's persistently <90 mL/min signify possible Chronic Kidney     Disease.  CG4 I-STAT (LACTIC ACID)     Status: None   Collection Time    06/16/13  1:25 PM      Result Value Ref Range   Lactic Acid, Venous 1.12  0.5 - 2.2 mmol/L  GLUCOSE, CAPILLARY     Status: Abnormal   Collection Time    06/18/13  6:12 AM      Result Value Ref Range   Glucose-Capillary 103 (*) 70 - 99 mg/dL   Psychological Evaluations:  Assessment:   DSM5:  Schizophrenia Disorders:  Schizophrenia (295.7) Obsessive-Compulsive Disorders:   Trauma-Stressor Disorders:   Substance/Addictive Disorders:  Cannabis Use Disorder - Severe (304.30) Depressive Disorders:  Major Depressive Disorder - Unspecified (296.20)  AXIS I:  Schizoaffective Disorder, Substance Abuse and  Substance Induced Mood Disorder AXIS II:  Deferred AXIS III:   Past Medical History  Diagnosis Date  . Paranoid schizophrenia    AXIS IV:  economic problems, housing problems, occupational problems, other psychosocial or environmental problems, problems related to social environment and problems with primary support group AXIS V:  41-50 serious symptoms  Treatment Plan/Recommendations:  Admit for crisis stabilization, safety monitoring and medication management of paranoid schizophrenia, and depression with suicidal ideation and a plan.  Treatment Plan Summary: Daily contact with patient to assess and evaluate symptoms and progress in treatment Medication management Current Medications:  Current Facility-Administered Medications  Medication Dose Route Frequency Provider Last Rate Last Dose  . acetaminophen (TYLENOL) tablet 650 mg  650 mg Oral Q6H PRN Waldon Merl, MD      . alum & mag hydroxide-simeth (MAALOX/MYLANTA) 200-200-20 MG/5ML suspension 30 mL  30 mL Oral Q4H PRN Waldon Merl, MD      . benztropine (COGENTIN) tablet 1 mg  1 mg Oral BID Waldon Merl, MD   1 mg at 06/19/13 0837  . divalproex (DEPAKOTE ER) 24 hr tablet 750 mg  750 mg Oral QHS Lurena Nida, NP      . haloperidol (HALDOL) tablet 10 mg  10 mg Oral Once Waldon Merl, MD      . magnesium hydroxide (MILK OF MAGNESIA) suspension 30 mL  30 mL Oral Daily PRN Waldon Merl, MD      . nicotine (NICODERM CQ - dosed in mg/24 hours) 21 mg/24hr patch           . nicotine (NICODERM CQ - dosed in mg/24 hours) patch 21 mg  21 mg Transdermal Q0600 Durward Parcel, MD   21 mg at 06/19/13 1209  . traZODone (DESYREL) tablet 100 mg  100 mg Oral QHS,MR X 1 Waldon Merl, MD   100 mg at 06/18/13 2249    Observation Level/Precautions:  15 minute checks  Laboratory:  Reviewed admission labs  Psychotherapy:  Individual therapy and group therapy and milieu therapy   Medications:  Discontinue Haldol as  patient is noncompliant with medication and start  risperidone 1 mg twice daily for psychosis, benztropine 1 mg twice daily for EPS and Depakote 750 mg at bedtime for mood swings. Patient take trazodone 100 mg at bedtime for insomnia.   Consultations:  None   Discharge Concerns:  Safety   Estimated LOS: 5-7 days   Other:     I certify that inpatient services furnished can reasonably be expected to improve the patient's condition.   Delaynee Alred,JANARDHAHA R. 2/13/201512:29 PM

## 2013-06-19 NOTE — [facility] Group Notes (Signed)
[facility] LCSW Group Therapy  06/19/2013 3:24 PM   Type of Therapy:  Group Therapy  Participation Level:  Invited, chose not to attend  Summary of Progress/Problems: Today's group focused on relapse prevention.  We defined the term, and then brainstormed on ways to prevent relapse.  Daryel Geraldorth, Jossie Smoot B 06/19/2013 , 3:24 PM

## 2013-06-19 NOTE — [facility] Group Notes (Signed)
[facility] Note   06/19/2013 10:20 AM  Participation Quality:   Did not attend    Raye Sorrowoble, Anahlia Iseminger N

## 2013-06-19 NOTE — Progress Notes (Signed)
D) Pt has spent a good part of the day in his bed. States he feels better by himself. Rates his depression at a 6 and his hopelessness at a 0 Admits to thoughts of SI . States "I am feeling very depressed. Pt reports that he is hearing "rumbles, but not voices". Denies HI and visual hallucinations. Eye contact is poor.  A) Provided with a 1:1. Given support and reassurance. Verbal contract made with Pt for his safety and the safety of others. R) Contracts for his safety while on the unit. Will come to staff should the noises or voices start.

## 2013-06-19 NOTE — [facility] Suicide Risk Assessment (Signed)
[facility] INPATIENT:  Family/Significant Other Suicide Prevention Education  Suicide Prevention Education:  Patient Refusal for Family/Significant Other Suicide Prevention Education: The patient [name] [name] has refused to provide written consent for family/significant other to be provided Family/Significant Other Suicide Prevention Education during admission and/or prior to discharge.  Physician notified.  Daryel Geraldorth, Stamatia Masri B 06/19/2013, 11:03 AM

## 2013-06-19 NOTE — [facility] Counselor (Signed)
Adult Psychosocial Assessment Update Interdisciplinary Team  [name]:  Admissions Discharges  Date: 05/24/13 Date:  Date: Date:  Date: Date:  Date: Date:  Date: Date:   Changes since the last Psychosocial Assessment (including adherence to outpatient mental health and/or substance abuse treatment, situational issues contributing to decompensation and/or relapse). [facility], but has not followed up at mental health.  "I'm hearing voices and can't sleep.  The haldol made my my mouth dry and my throat feel like it was closing up.  The Dr says he will try me on Risperdal.  I came to the ED myself for help."             Discharge Plan 1. Will you be returning to the same living situation after discharge?   Yes:X No:      If no, what is your plan?    Return to shelter.  Believes he still has about 35 days left there.  "Hopefully it will be warm then."       2. Would you like a referral for services when you are discharged? Yes: X    If yes, for what services?  No:       Mental health       Summary and Recommendations (to be completed by the evaluator) [name] [name] is a 53 YO Caucasian male who, rather than following up with outpt services in a preventative manner, felt it would be better to come back to the hospital.  He is depressed and c/o psychosis.  He can benefit from crises stabilization, medication management, therapeutic milieu and referral for services.                       Signature:  [name], [name] [name], 06/19/2013 11:04 AM

## 2013-06-19 NOTE — [facility] Suicide Risk Assessment (Signed)
   Nursing information obtained from:  Patient Demographic factors:  Male;Low socioeconomic status Current Mental Status:  Self-harm thoughts Loss Factors:  Financial problems / change in socioeconomic status Historical Factors:  Prior suicide attempts Risk Reduction Factors:   (none) Total Time spent with patient: 45 minutes  CLINICAL FACTORS:   Depression:   Anhedonia Hopelessness Impulsivity Insomnia Recent sense of peace/wellbeing Severe Schizophrenia:   Depressive state Paranoid or undifferentiated type Currently Psychotic Unstable or Poor Therapeutic Relationship Previous Psychiatric Diagnoses and Treatments Medical Diagnoses and Treatments/Surgeries  Psychiatric Specialty Exam: Physical Exam  ROS  Blood pressure 99/61, pulse 86, temperature 97.6 F (36.4 C), temperature source Oral, resp. rate 16, height 5\' 7"  (1.702 m), weight 73.029 kg (161 lb), SpO2 96.00%.Body mass index is 25.21 kg/(m^2).  General Appearance: Bizarre, Disheveled and Guarded  Eye Contact::  Minimal  Speech:  Blocked, Clear and Coherent and Slow  Volume:  Decreased  Mood:  Anxious and Depressed  Affect:  Depressed and Restricted  Thought Process:  Goal Directed and Intact  Orientation:  Full (Time, Place, and Person)  Thought Content:  Paranoid Ideation  Suicidal Thoughts:  Yes.  with intent/plan  Homicidal Thoughts:  No  Memory:  Immediate;   Fair  Judgement:  Impaired  Insight:  Lacking  Psychomotor Activity:  Psychomotor Retardation  Concentration:  Fair  Recall:  [employer]Fair  Fund of Knowledge:Fair  Language: Fair  Akathisia:  No  Handed:  Right  AIMS (if indicated):     Assets:  Communication Skills Desire for Improvement Leisure Time Resilience Social Support  Sleep:  Number of Hours: 5.75   Musculoskeletal: Strength & Muscle Tone: within normal limits Gait & Station: normal Patient leans: N/A  COGNITIVE FEATURES THAT CONTRIBUTE TO RISK:  Closed-mindedness Loss of executive  function Polarized thinking    SUICIDE RISK:   Moderate:  Frequent suicidal ideation with limited intensity, and duration, some specificity in terms of plans, no associated intent, good self-control, limited dysphoria/symptomatology, some risk factors present, and identifiable protective factors, including available and accessible social support.  PLAN OF CARE: Admitted for crisis stabilization, safety monitoring and medication management of paranoid schizophrenia and depression with suicidal ideation. Patient also has a cannabis abuse.  I certify that inpatient services furnished can reasonably be expected to improve the patient's condition.  Lexine Jaspers,JANARDHAHA R. 06/19/2013, 12:27 PM

## 2013-06-20 DIAGNOSIS — R45851 Suicidal ideations: Secondary | ICD-10-CM

## 2013-06-20 MED ORDER — IBUPROFEN 600 MG PO TABS
600.0000 mg | ORAL_TABLET | Freq: Four times a day (QID) | ORAL | Status: DC | PRN
  Administered 2013-06-20 – 2013-06-25 (×6): 600 mg via ORAL
  Filled 2013-06-20 (×6): qty 1

## 2013-06-20 NOTE — Progress Notes (Signed)
Patient ID: [name] [name], male   DOB: 04-26-1961, 53 y.o.   MR[facility]: [id number] [facility]  06/20/2013 12:34 PM [name] [name]  MRN:  [id number] Assessment: Patient reports auditory hallucinations continuing but comments that it usually takes a couple of weeks for his medications to be effective. [name] also complains of racing thoughts.   He complained of neck pain from degenerative disc disease, ibuprofen ordered for relief.  Patient stated he slept well last night but felt tired today.  He was lying on his bed resting versus attending group.  Appetite is fair.  Forwards little information but answers questions appropriately.  Diagnosis:   DSM5: Schizophrenia Disorders:  Schizophrenia (295.7)  Depressive Disorders:  Major Depressive Disorder - Severe (296.23), Mood disorder  Axis I: Chronic Paranoid Schizophrenia. Axis II: Deferred Axis III:  Past Medical History  Diagnosis Date  . Head Lice    Axis IV: other psychosocial or environmental problems, problems related to social environment and problems with primary support group Axis V: 41-50 serious symptoms  ADL's:  Intact  Sleep: Good  Appetite:  Fair  Suicidal Ideation: yes Plan:  overdose Intent:  none Means:  none Homicidal Ideation:  Denies   Psychiatric Specialty Exam: Review of Systems  Constitutional: Negative.   HENT: Negative.   Eyes: Negative.   Respiratory: Negative.   Cardiovascular: Negative.   Gastrointestinal: Negative.   Genitourinary: Negative.   Musculoskeletal: Positive for joint pain (Patient reports mild pain to left hand. ).  Skin: Negative.   Neurological: Negative.   Endo/Heme/Allergies: Negative.   Psychiatric/Behavioral: Positive for depression, suicidal ideas and hallucinations. Negative for memory loss and substance abuse.    Blood pressure 104/65, pulse 80, temperature 97.5 F (36.4 C), temperature source Oral, resp. rate 20, height 5\' 7"  (1.702 m), weight 73.029 kg (161  lb), SpO2 96.00%.Body mass index is 25.21 kg/(m^2).  General Appearance: Dishelved, nonmalodorous   Eye Contact::  Poor  Speech:  Normal  Volume:  Decreased  Mood:  Depressed  Affect:  Congruent  Thought Process:  Coherent  Orientation:  Full (Time, Place, and Person)  Thought Content: psychotic, auditory hallucinations  Suicidal Thoughts:  Yes.  with intent/plan  Homicidal Thoughts:  No  Memory:  Immediate;   Fair Recent;   Fair Remote;   Fair  Judgement:  Poor  Insight:  Lacking  Psychomotor Activity:  Decreased  Concentration:  Fair  Recall:  Fair  Akathisia:  No  Handed:  Right  AIMS (if indicated):     Assets:  Leisure Time Physical Health Resilience  Sleep:  Number of Hours: 5.5   Current Medications: Current Facility-Administered Medications  Medication Dose Route Frequency Provider Last Rate Last Dose  . alum & mag hydroxide-simeth (MAALOX/MYLANTA) 200-200-20 MG/5ML suspension 30 mL  30 mL Oral Q4H PRN Larena SoxShaji J Puthuvel, MD      . benztropine (COGENTIN) tablet 1 mg  1 mg Oral BID Larena SoxShaji J Puthuvel, MD   1 mg at 06/20/13 0845  . divalproex (DEPAKOTE ER) 24 hr tablet 750 mg  750 mg Oral QHS Kristeen MansFran E Hobson, NP   750 mg at 06/19/13 2124  . ibuprofen (ADVIL,MOTRIN) tablet 600 mg  600 mg Oral Q6H PRN Nanine MeansJamison Lord, NP      . magnesium hydroxide (MILK OF MAGNESIA) suspension 30 mL  30 mL Oral Daily PRN Larena SoxShaji J Puthuvel, MD      . nicotine (NICODERM CQ - dosed in mg/24 hours) patch 21 mg  21 mg Transdermal  [id number] Nehemiah Settle, MD   21 mg at 06/20/13 0849  . risperiDONE (RISPERDAL) tablet 1 mg  1 mg Oral BID Nehemiah Settle, MD   1 mg at 06/20/13 0846  . traZODone (DESYREL) tablet 100 mg  100 mg Oral QHS,MR X 1 Larena Sox, MD   100 mg at 06/20/13 6045    Lab Results:  No results found for this or any previous visit (from the past 48 hour(s)).  Physical Findings: AIMS: Facial and Oral Movements Muscles of Facial Expression: None, normal Lips and  Perioral Area: None, normal Jaw: None, normal Tongue: None, normal,Extremity Movements Upper (arms, wrists, hands, fingers): None, normal Lower (legs, knees, ankles, toes): None, normal, Trunk Movements Neck, shoulders, hips: None, normal, Overall Severity Severity of abnormal movements (highest score from questions above): None, normal Incapacitation due to abnormal movements: None, normal Patient's awareness of abnormal movements (rate only patient's report): No Awareness, Dental Status Current problems with teeth and/or dentures?: No Does patient usually wear dentures?: No  CIWA:    COWS:     Treatment Plan Summary: Daily contact with patient to assess and evaluate symptoms and progress in treatment Medication management  Plan:  Review of chart, vital signs, medications, and notes. 1-Individual and group therapy 2-Medication management : Continue Depakote to 750mg  po BID for Bipolar, Haldol to 10mg  po BID for Psychosis, Trazodone to 200mg  po Qhs for complaint of insomnia.  3-Coping skills for depression, anxiety, and psychosis 4-Continue crisis stabilization and management 5-Address health issues--complained of neck pain, ibuprofen ordered 6-Treatment plan in progress to prevent relapse of depression, psychosis, and anxiety 7- Patient received treatment for head lice and has been cleared by ID to be taken off contact precautions.   Medical Decision Making Problem Points:  Established problem, improving (1) and Review of psycho-social stressors (1), New problem with workup (1)  Data Points:  Review or order clinical lab tests (1) Review of medication regiment & side effects (2) Review of new medications or change in dosage (2)  I certify that inpatient services furnished can reasonably be expected to improve the patient's condition.   Nanine Means, PMH-NP 06/20/2013, 12:34 PM   Patient seen, evaluated and I agree with notes by Nurse Practitioner. Thedore Mins, MD

## 2013-06-20 NOTE — [facility] Group Notes (Signed)
[facility] Group Notes: (Clinical Social Work)   06/20/2013      Type of Therapy:  Group Therapy   Participation Level:  Did Not Attend    Ambrose MantleMareida Grossman-Orr, LCSW 06/20/2013, 3:39 PM

## 2013-06-20 NOTE — Progress Notes (Signed)
D) Pt has spent most of his day in the bed, except for meals. Rates his depression at an 8, hopelessness at a 5 and states that he is having thoughts of SI. States that he is hearing voices that are telling him to hurt himself. Wants to stay to himself because noise can be too much for him to handle. A) Given support, reassurance and praise. Verbal contract made with Pt for his safety. Not pushed to go to groups today. R) Pt contracts for his safety. Hearing voices that tell him to hurt himself. States he will come to the staff should he not be able to interrupt his own behavior.

## 2013-06-20 NOTE — Progress Notes (Signed)
Pt did not attend wrap up group this evening.  

## 2013-06-20 NOTE — Progress Notes (Signed)
Writer has observed patient isolative to his room most of the evening other than snack time and to use the phone. Patient requested to use the phone during group in order to call the shelter to keep his bed. Writer did make exceptions to phone rules and encouraged him to call before group if needing to do this each night. Patient was receptive and returned to his room after placing call. He did not attend group, voiced no complaints and is compliant with his scheduled medication. He currently denies si/hi/a/v hallucinations. He plans to return to the shelter once discharged and he feels that his medications are working for him. Safety maintained on unit with 15 min checks.

## 2013-06-21 DIAGNOSIS — F200 Paranoid schizophrenia: Secondary | ICD-10-CM

## 2013-06-21 MED ORDER — TRAZODONE HCL 100 MG PO TABS
200.0000 mg | ORAL_TABLET | Freq: Every day | ORAL | Status: DC
  Administered 2013-06-21: 200 mg via ORAL
  Filled 2013-06-21 (×3): qty 2

## 2013-06-21 MED ORDER — DIVALPROEX SODIUM ER 500 MG PO TB24
750.0000 mg | ORAL_TABLET | Freq: Two times a day (BID) | ORAL | Status: DC
  Administered 2013-06-21 – 2013-06-25 (×8): 750 mg via ORAL
  Filled 2013-06-21 (×10): qty 1

## 2013-06-21 MED ORDER — TRAZODONE HCL 150 MG PO TABS
150.0000 mg | ORAL_TABLET | Freq: Every day | ORAL | Status: DC
  Filled 2013-06-21: qty 1

## 2013-06-21 NOTE — Progress Notes (Signed)
Writer spoke with patient 1:1 at medication window and he c/o neck pain and requested ibuprofen which he received along with a heat pack. Writer inquired about him being isolative to his room and if he was experiencing voices and he reports that he does and verbally contracts with staff. Patient denies si/hi/visual hallucinations. Writer encouraged patient to notify staff if voices became too intense and he agreed. Safety maintained on unit with 15 min checks.

## 2013-06-21 NOTE — [facility] Group Notes (Signed)
[facility] Group Notes: (Clinical Social Work)   06/21/2013      Type of Therapy:  Group Therapy   Participation Level:  Did Not Attend - came into group for a little bit, was asked two questions and responded "I don't know" to both, then left.   [name] MantleMareida Grossman-Orr, LCSW 06/21/2013, 5:10 PM

## 2013-06-21 NOTE — Progress Notes (Signed)
Writer observed patient come to wrap up group but left and did not participate. Patient came to medication window and writer spoke with him 1:1 and inquired as to why he left group and he reported that he felt agitated and went to his room. Writer supported his decision and asked if he needed medication to help with this feeling and he reported that he was ok and would Pharmacist, hospitallet writer know if his feeling of agitation increased. He requested his hs medication and c/o neck pain which he received ibuprofen and cold pack. He reports that the voices are still there and contracts with Clinical research associatewriter if voices become worse or plans to act on them. He denies si/hi/visual hallucinations. Safety maintained on unit with 15 min checks.

## 2013-06-21 NOTE — Progress Notes (Signed)
[facility] Group Notes:  (Nursing/MHT/Case Management/Adjunct)  Date:  06/21/2013  Time:  12:04 AM  Type of Therapy:  Group Therapy  Participation Level:  Did Not Attend  Participation Quality:  Did Not Attend  Affect:  Did Not Attend  Cognitive:  Did Not Attend  Insight:  None  Engagement in Group:  Did Not Attend  Modes of Intervention:  Socialization and Support  Summary of Progress/Problems: Pt. Was resting in bed.  Sondra ComeWilson, Shilo Pauwels J 06/21/2013, 12:04 AM

## 2013-06-21 NOTE — Progress Notes (Signed)
Patient ID: [name] [name] Mcbean, male   DOB: 1960/10/27, 53 y.o.   MRN: [id number] [facility]  06/21/2013 3:52 PM [name] [name] Sytsma  MRN:  [id number] Objective: Patient states " I am still having mood swings and suicidal.'' Objective: Patient reports ongoing paranoia, mood swings, depression and difficulty sleeping. He states that he is still hearing voices telling him to hurt himself but he has no plan to obey the voices. He is requesting for his sleeping medication to be increased or changed to Ambien. Patient verbalizes hopelessness due to being homeless and worry about lack of money. He is compliant with his current medications and has not endorsed any adverse reactions.  Diagnosis:   DSM5: Schizophrenia Disorders:  Schizophrenia (295.7)  Depressive Disorders:  Major Depressive Disorder - Severe (296.23), Mood disorder  Axis I: Chronic Paranoid Schizophrenia. Axis II: Deferred Axis III:  Past Medical History  Diagnosis Date  . Head Lice    Axis IV: other psychosocial or environmental problems, problems related to social environment and problems with primary support group Axis V: 41-50 serious symptoms  ADL's:  Intact  Sleep: Good  Appetite:  Fair  Suicidal Ideation: yes Plan:  overdose Intent:  none Means:  none Homicidal Ideation:  Denies   Psychiatric Specialty Exam: Review of Systems  Constitutional: Negative.   HENT: Negative.   Eyes: Negative.   Respiratory: Negative.   Cardiovascular: Negative.   Gastrointestinal: Negative.   Genitourinary: Negative.   Musculoskeletal: Positive for joint pain (Patient reports mild pain to left hand. ).  Skin: Negative.   Neurological: Negative.   Endo/Heme/Allergies: Negative.   Psychiatric/Behavioral: Positive for depression, suicidal ideas and hallucinations. Negative for memory loss and substance abuse.    Blood pressure 105/68, pulse 89, temperature 97.5 F (36.4 C), temperature source Oral, resp. rate 17, height  5\' 7"  (1.702 m), weight 73.029 kg (161 lb), SpO2 96.00%.Body mass index is 25.21 kg/(m^2).  General Appearance: Dishelved, nonmalodorous   Eye Contact::  Poor  Speech:  Normal  Volume:  Decreased  Mood:  Depressed  Affect:  Congruent  Thought Process:  Coherent  Orientation:  Full (Time, Place, and Person)  Thought Content: psychotic, auditory hallucinations  Suicidal Thoughts:  Yes.  with intent/plan  Homicidal Thoughts:  No  Memory:  Immediate;   Fair Recent;   Fair Remote;   Fair  Judgement:  Poor  Insight:  Lacking  Psychomotor Activity:  Decreased  Concentration:  Fair  Recall:  Fair  Akathisia:  No  Handed:  Right  AIMS (if indicated):     Assets:  Leisure Time Physical Health Resilience  Sleep:  Number of Hours: 6   Current Medications: Current Facility-Administered Medications  Medication Dose Route Frequency Provider Last Rate Last Dose  . alum & mag hydroxide-simeth (MAALOX/MYLANTA) 200-200-20 MG/5ML suspension 30 mL  30 mL Oral Q4H PRN Larena SoxShaji J Puthuvel, MD      . benztropine (COGENTIN) tablet 1 mg  1 mg Oral BID Larena SoxShaji J Puthuvel, MD   1 mg at 06/21/13 0915  . divalproex (DEPAKOTE ER) 24 hr tablet 750 mg  750 mg Oral QHS Kristeen MansFran E Hobson, NP   750 mg at 06/20/13 2107  . ibuprofen (ADVIL,MOTRIN) tablet 600 mg  600 mg Oral Q6H PRN Nanine MeansJamison Lord, NP   600 mg at 06/20/13 1936  . magnesium hydroxide (MILK OF MAGNESIA) suspension 30 mL  30 mL Oral Daily PRN Larena SoxShaji J Puthuvel, MD      . nicotine (NICODERM CQ -  dosed in mg/24 hours) patch 21 mg  21 mg Transdermal Q0600 Nehemiah Settle, MD   21 mg at 06/21/13 0917  . risperiDONE (RISPERDAL) tablet 1 mg  1 mg Oral BID Nehemiah Settle, MD   1 mg at 06/21/13 0915  . traZODone (DESYREL) tablet 150 mg  150 mg Oral QHS Emalina Dubreuil        Lab Results:  No results found for this or any previous visit (from the past 48 hour(s)).  Physical Findings: AIMS: Facial and Oral Movements Muscles of Facial Expression:  None, normal Lips and Perioral Area: None, normal Jaw: None, normal Tongue: None, normal,Extremity Movements Upper (arms, wrists, hands, fingers): None, normal Lower (legs, knees, ankles, toes): None, normal, Trunk Movements Neck, shoulders, hips: None, normal, Overall Severity Severity of abnormal movements (highest score from questions above): None, normal Incapacitation due to abnormal movements: None, normal Patient's awareness of abnormal movements (rate only patient's report): No Awareness, Dental Status Current problems with teeth and/or dentures?: No Does patient usually wear dentures?: No  CIWA:    COWS:     Treatment Plan Summary: Daily contact with patient to assess and evaluate symptoms and progress in treatment Medication management  Plan:  Review of chart, vital signs, medications, and notes. 1-Individual and group therapy 2-Medication management : Continue Depakote to 750mg  po BID for Bipolar, Haldol to 10mg  po BID for Psychosis,  Increase Trazodone to 200mg  po Qhs for  insomnia.  3-Coping skills for depression, anxiety, and psychosis 4-Continue crisis stabilization and management 5-Address health issues--complained of neck pain, ibuprofen ordered 6-Treatment plan in progress to prevent relapse of depression, psychosis, and anxiety 7- Patient received treatment for head lice and has been cleared by ID to be taken off contact precautions.   Medical Decision Making Problem Points:  Established problem, improving (1) and Review of psycho-social stressors (1), New problem with workup (1)  Data Points:  Review or order clinical lab tests (1) Review of medication regiment & side effects (2) Review of new medications or change in dosage (2)  I certify that inpatient services furnished can reasonably be expected to improve the patient's condition.   Thedore Mins, MD 06/21/2013, 3:52 PM

## 2013-06-21 NOTE — Progress Notes (Signed)
Late entry for 06-20-2013 written on 06-21-2013   Psychoeducational Group Note  Date: 06/21/2013 Time:  1015  Group Topic/Focus:  Identifying Needs:   The focus of this group is to help patients identify their personal needs that have been historically problematic and identify healthy behaviors to address their needs.  Participation Level:  Did not attend [name] 

## 2013-06-21 NOTE — Progress Notes (Signed)
D) Pt came to one of the groups today. States that he continues to hear voices that tell him to kill himself. Affect is flat and mood depressed. Rates his depression and hopelessness both at a 10 and admits to thoughts of SI. Stated today that [facility] but cannot due to the voices telling him not to. Has gotten on the bus and had to get off the bus, because the "voices made me do it. [facility]". A) Given support, reassurance and praise. Encouraged to go to the groups. Verbal contract with Pt made.  R) Contracts for safety in the hospital.

## 2013-06-21 NOTE — Progress Notes (Signed)
Psychoeducational Group Note  Date: 06/21/2013 Time:  0930 Group Topic/Focus:  Gratefulness:  The focus of this group is to help patients identify what two things they are most grateful for in their lives. What helps ground them and to center them on their work to their recovery.  Participation Level:  Active  Participation Quality:  Appropriate  Affect:  Appropriate  Cognitive:  Oriented  Insight:  Improving  Engagement in Group:  Engaged  Additional Comments:  participated in the group.  Doyne Micke A   

## 2013-06-21 NOTE — Progress Notes (Signed)
Psychoeducational Group Note  Date:  06/21/2013 Time:  1015  Group Topic/Focus:  Making Healthy Choices:   The focus of this group is to help patients identify negative/unhealthy choices they were using prior to admission and identify positive/healthier coping strategies to replace them upon discharge.  Participation Level:  Active  Participation Quality:  Appropriate  Affect:  Appropriate  Cognitive:  Oriented  Insight:  Improving  Engagement in Group:  Engaged  Additional Comments:    Tobiah Celestine A 06/21/2013 

## 2013-06-21 NOTE — Progress Notes (Signed)
Late entry for 06/20/2013. Written on 06/21/2013  Psychoeducational Group Note    Date: 06/21/2013 Time:  0930  Goal Setting Purpose of Group: To be able to set a goal that is measurable and that can be accomplished in one day Participation Level: Did not attend  Shavona Gunderman A 

## 2013-06-22 MED ORDER — TRAZODONE HCL 100 MG PO TABS
100.0000 mg | ORAL_TABLET | Freq: Every day | ORAL | Status: DC
  Filled 2013-06-22 (×4): qty 1

## 2013-06-22 MED ORDER — RISPERIDONE 2 MG PO TABS
2.0000 mg | ORAL_TABLET | Freq: Two times a day (BID) | ORAL | Status: DC
  Administered 2013-06-22 – 2013-06-24 (×4): 2 mg via ORAL
  Filled 2013-06-22 (×9): qty 1

## 2013-06-22 NOTE — Progress Notes (Addendum)
D:  Patient's self inventory sheet, patient has fair sleep, good appetite, low energy level, poor attention span.  Rated depression 8, hopeless and anxiety 10.  Denied withdrawals.  SI, contracts for safety.  Has experienced neck pain in past 24 hours.  Worst pain 10, zero pain goal.  Patient plans to go to homeless shelter after discharge.  Needs financial assistance for medications.   A:  Medications administered per MD orders.  Emotional support and encouragement given patient. R:  SI, contracts for safety.  Denied HI.  Denied visual hallucinations.   Patient does hear voices "Ronnald Rampjibber, to hurt myself."  Will continue to monitor patient for safety with 15 minute checks.  Safety maintained.  Voices continue to hurt himself, "Jibber".  Patient has stayed in bed this morning.  Encouraged patient to participate in group activities.

## 2013-06-22 NOTE — Tx Team (Signed)
Interdisciplinary Treatment Plan Update   Date Reviewed:  06/22/2013  Time Reviewed:  8:31 AM  Progress in Treatment:   Attending groups: Yes Participating in groups: Yes Taking medication as prescribed: Yes  Tolerating medication: Yes Family/Significant other contact made:  No, but will ask patient for consent for collateral contact Patient understands diagnosis: Yes  Discussing patient identified problems/goals with staff: Yes Medical problems stabilized or resolved: Yes Denies suicidal/homicidal ideation: Yes Patient has not harmed self or others: Yes  For review of initial/current patient goals, please see plan of care.  Estimated Length of Stay:  3-5 days  Reasons for Continued Hospitalization:  Anxiety Depression Medication stabilization Auditory Hallucination Suicidal ideation   New Problems/Goals identified:    Discharge Plan or Barriers:   Home with outpatient follow up to be determined  Additional Comments:  [name] [name] is a 53 year old male who was admitted voluntarily and emergently [facility] with the depression and feeling suicidal ideation with a plan of jumping out of the bridge. Patient reported that he has been homeless and [facility] [place] and has been noncompliant with the medication because his medication is making him feel not good. Patient reported he continued to have a paranoid thoughts and suicidal ideation. Patient also reported hearing voices telling him to kill himself. Patient has been very agitated, labile and responding to internal stimuli. Patient states "I wanted to kill myself. I am tired of everything. Patient Has Been Taking His Medication since 2900 and Medications Don't Don't Work Any Longer for Him. Patient does report has been smoking marijuana. He have no support. The patient is a poor historian and is unable to remember which medications he took in the past.   Attendees:  Patient:  06/22/2013 8:31 AM   Signature: Mervyn GayJ.  Jonnalagadda, MD 06/22/2013 8:31 AM  Signature:   06/22/2013 8:31 AM  Signature:  Claudette Headonrad Withrow, NP 06/22/2013 8:31 AM  Signature:Beverly Terrilee CroakKnight, RN 06/22/2013 8:31 AM  Signature:  Neill Loftarol Benincasa RN 06/22/2013 8:31 AM  Signature:  Juline PatchQuylle Chiamaka Latka, LCSW 06/22/2013 8:31 AM  Signature:  Reyes Ivanhelsea Horton, LCSW 06/22/2013 8:31 AM  Signature:  Leisa LenzValerie Enoch, Care Coordinator [facility] 06/22/2013 8:31 AM  Signature:  Aloha GellKrista Dopson, RN 06/22/2013 8:31 AM  Signature: 06/22/2013  8:31 AM  Signature:   Onnie BoerJennifer Clark, RN [facility] 06/22/2013  8:31 AM  Signature:   06/22/2013  8:31 AM    Scribe for Treatment Team:   Juline PatchQuylle Mikayah Joy,  06/22/2013 8:31 AM

## 2013-06-22 NOTE — [facility] Group Notes (Signed)
[facility] LCSW Group Therapy          Overcoming Obstacles       1:15 -2:30        06/22/2013   2:52 PM     Type of Therapy:  Group Therapy  Participation Level:  Patient did not attend group.  Wynn BankerHodnett, Yamin Swingler Hairston 06/22/2013   2:52 PM

## 2013-06-22 NOTE — [facility] Group Notes (Signed)
[facility]   06/22/2013 10:29 AM  Participation Quality:  Did not attend group.  [name] [name], [name] [name]

## 2013-06-22 NOTE — Progress Notes (Signed)
D Pt. Has isolated most of this pm, still reports passive SI denies HI.   A Writer offers support ane encouragement.    R Pt. Remains safe on the unit.  States he will seek out staff if having SI thoughts

## 2013-06-22 NOTE — Progress Notes (Signed)
Patient ID: [name] [name], male   DOB: June 06, 1960, 53 y.o.   MRN: [id number] Patient ID: [name] [name], male   DOB: [birth year], 53 y.o.   MRN: [id number] [facility] MD Progress Note  06/22/2013 1:06 PM [name] [name]  MRN:  [id number] Objective: Patient states " I am still having auditory hallucinations mood swings and suicidal.''  Objective: Patient appeared lying in his bed with the complaints of auditory hallucinations but cannot make out what they're saying and also reports neck pain. Patient reportedly slept good last night and eating okay. Patient continued to have suicide ideation without intention or plan. Patient reports ongoing paranoia, mood swings, depression and difficulty sleeping. He states that he is still hearing voices telling him to hurt himself but he has no plan to obey the voices. He is requesting for his antipsychotic medication to be increased for better control of auditory hallucinations. Patient verbalizes hopelessness due to being homeless and worry about lack of money. He is compliant with his current medications and has not endorsed any adverse reactions. Patient was encouraged to participate in milieu therapy and group therapies. Patient is in agreement with head twisting his medication risperidone and trazodone but it  Diagnosis:   DSM5: Schizophrenia Disorders:  Schizophrenia (295.7)  Depressive Disorders:  Major Depressive Disorder - Severe (296.23), Mood disorder  Axis I: Chronic Paranoid Schizophrenia. Axis II: Deferred Axis III:  Past Medical History  Diagnosis Date  . Head Lice    Axis IV: other psychosocial or environmental problems, problems related to social environment and problems with primary support group Axis V: 41-50 serious symptoms  ADL's:  Intact  Sleep: Good  Appetite:  Fair  Suicidal Ideation: yes Plan:  overdose Intent:  none Means:  none Homicidal Ideation:  Denies   Psychiatric Specialty Exam: Review of Systems  Constitutional:  Negative.   HENT: Negative.   Eyes: Negative.   Respiratory: Negative.   Cardiovascular: Negative.   Gastrointestinal: Negative.   Genitourinary: Negative.   Musculoskeletal: Positive for joint pain (Patient reports mild pain to left hand. ).  Skin: Negative.   Neurological: Negative.   Endo/Heme/Allergies: Negative.   Psychiatric/Behavioral: Positive for depression, suicidal ideas and hallucinations. Negative for memory loss and substance abuse.    Blood pressure 104/68, pulse 76, temperature 98 F (36.7 C), temperature source Oral, resp. rate 16, height 5\' 7"  (1.702 m), weight 73.029 kg (161 lb), SpO2 96.00%.Body mass index is 25.21 kg/(m^2).  General Appearance: Dishelved, nonmalodorous   Eye Contact::  Poor  Speech:  Normal  Volume:  Decreased  Mood:  Depressed  Affect:  Congruent  Thought Process:  Coherent  Orientation:  Full (Time, Place, and Person)  Thought Content: psychotic, auditory hallucinations  Suicidal Thoughts:  Yes.  with intent/plan  Homicidal Thoughts:  No  Memory:  Immediate;   Fair Recent;   Fair Remote;   Fair  Judgement:  Poor  Insight:  Lacking  Psychomotor Activity:  Decreased  Concentration:  Fair  Recall:  Fair  Akathisia:  No  Handed:  Right  AIMS (if indicated):     Assets:  Leisure Time Physical Health Resilience  Sleep:  Number of Hours: 5.75   Current Medications: Current Facility-Administered Medications  Medication Dose Route Frequency Provider Last Rate Last Dose  . alum & mag hydroxide-simeth (MAALOX/MYLANTA) 200-200-20 MG/5ML suspension 30 mL  30 mL Oral Q4H PRN Larena Sox, MD      . benztropine (COGENTIN) tablet 1 mg  1 mg Oral BID Otelia Santee  Aundra MilletJ Puthuvel, MD   1 mg at 06/22/13 0858  . divalproex (DEPAKOTE ER) 24 hr tablet 750 mg  750 mg Oral BID PC Mojeed Akintayo   750 mg at 06/22/13 0858  . ibuprofen (ADVIL,MOTRIN) tablet 600 mg  600 mg Oral Q6H PRN Nanine MeansJamison Lord, NP   600 mg at 06/22/13 0909  . magnesium hydroxide (MILK OF  MAGNESIA) suspension 30 mL  30 mL Oral Daily PRN Larena SoxShaji J Puthuvel, MD      . nicotine (NICODERM CQ - dosed in mg/24 hours) patch 21 mg  21 mg Transdermal Q0600 Nehemiah SettleJanardhaha R Alleyne Lac, MD   21 mg at 06/22/13 0859  . risperiDONE (RISPERDAL) tablet 1 mg  1 mg Oral BID Nehemiah SettleJanardhaha R Maxwel Meadowcroft, MD   1 mg at 06/22/13 0900  . traZODone (DESYREL) tablet 200 mg  200 mg Oral QHS Mojeed Akintayo   200 mg at 06/21/13 2102    Lab Results:  No results found for this or any previous visit (from the past 48 hour(s)).  Physical Findings: AIMS: Facial and Oral Movements Muscles of Facial Expression: None, normal Lips and Perioral Area: None, normal Jaw: None, normal Tongue: None, normal,Extremity Movements Upper (arms, wrists, hands, fingers): None, normal Lower (legs, knees, ankles, toes): None, normal, Trunk Movements Neck, shoulders, hips: None, normal, Overall Severity Severity of abnormal movements (highest score from questions above): None, normal Incapacitation due to abnormal movements: None, normal Patient's awareness of abnormal movements (rate only patient's report): No Awareness, Dental Status Current problems with teeth and/or dentures?: No Does patient usually wear dentures?: No  CIWA:  CIWA-Ar Total: 2 COWS:  COWS Total Score: 2  Treatment Plan Summary: Daily contact with patient to assess and evaluate symptoms and progress in treatment Medication management  Plan:  Review of chart, vital signs, medications, and notes. 1-Individual and group therapy 2-Medication management :  Continue Depakote to 750mg  po BID for Bipolar, increase Risperdal 2 mg mg po BID for Psychosis,  decrease Trazodone to 100mg  po Qhs for  insomnia.  Check valproic acid level, CBC with differential and comprehensive metabolic panel tomorrow morning 3-Coping skills for depression, anxiety, and psychosis 4-Continue crisis stabilization and management 5-Address health issues--complained of neck pain, ibuprofen  ordered 6-Treatment plan in progress to prevent relapse of depression, psychosis, and anxiety 7- Patient received treatment for head lice and has been cleared by ID to be taken off contact precautions.   Medical Decision Making Problem Points:  Established problem, improving (1) and Review of psycho-social stressors (1), New problem with workup (1)  Data Points:  Review or order clinical lab tests (1) Review of medication regiment & side effects (2) Review of new medications or change in dosage (2)  I certify that inpatient services furnished can reasonably be expected to improve the patient's condition.   Nehemiah SettleJONNALAGADDA,JANARDHAHA R., MD 06/22/2013, 1:06 PM

## 2013-06-23 LAB — COMPREHENSIVE METABOLIC PANEL
ALT: 9 U/L (ref 0–53)
AST: 11 U/L (ref 0–37)
Albumin: 3.6 g/dL (ref 3.5–5.2)
Alkaline Phosphatase: 68 U/L (ref 39–117)
BUN: 16 mg/dL (ref 6–23)
CALCIUM: 9.3 mg/dL (ref 8.4–10.5)
CO2: 26 meq/L (ref 19–32)
Chloride: 102 mEq/L (ref 96–112)
Creatinine, Ser: 0.9 mg/dL (ref 0.50–1.35)
GLUCOSE: 87 mg/dL (ref 70–99)
Potassium: 4.5 mEq/L (ref 3.7–5.3)
Sodium: 140 mEq/L (ref 137–147)
Total Bilirubin: 0.3 mg/dL (ref 0.3–1.2)
Total Protein: 6.9 g/dL (ref 6.0–8.3)

## 2013-06-23 LAB — CBC WITH DIFFERENTIAL/PLATELET
Basophils Absolute: 0.1 10*3/uL (ref 0.0–0.1)
Basophils Relative: 1 % (ref 0–1)
EOS ABS: 0.6 10*3/uL (ref 0.0–0.7)
Eosinophils Relative: 6 % — ABNORMAL HIGH (ref 0–5)
HEMATOCRIT: 39.7 % (ref 39.0–52.0)
Hemoglobin: 13.8 g/dL (ref 13.0–17.0)
LYMPHS ABS: 2.4 10*3/uL (ref 0.7–4.0)
LYMPHS PCT: 27 % (ref 12–46)
MCH: 32.2 pg (ref 26.0–34.0)
MCHC: 34.8 g/dL (ref 30.0–36.0)
MCV: 92.8 fL (ref 78.0–100.0)
MONO ABS: 0.6 10*3/uL (ref 0.1–1.0)
Monocytes Relative: 7 % (ref 3–12)
Neutro Abs: 5.3 10*3/uL (ref 1.7–7.7)
Neutrophils Relative %: 59 % (ref 43–77)
Platelets: 270 10*3/uL (ref 150–400)
RBC: 4.28 MIL/uL (ref 4.22–5.81)
RDW: 13 % (ref 11.5–15.5)
WBC: 8.9 10*3/uL (ref 4.0–10.5)

## 2013-06-23 LAB — VALPROIC ACID LEVEL: Valproic Acid Lvl: 78.4 ug/mL (ref 50.0–100.0)

## 2013-06-23 MED ORDER — METHOCARBAMOL 750 MG PO TABS
750.0000 mg | ORAL_TABLET | Freq: Three times a day (TID) | ORAL | Status: DC | PRN
  Administered 2013-06-23: 750 mg via ORAL
  Filled 2013-06-23: qty 10
  Filled 2013-06-23: qty 2

## 2013-06-23 MED ORDER — CITALOPRAM HYDROBROMIDE 20 MG PO TABS
20.0000 mg | ORAL_TABLET | Freq: Every day | ORAL | Status: DC
  Filled 2013-06-23 (×2): qty 1

## 2013-06-23 MED ORDER — CITALOPRAM HYDROBROMIDE 10 MG PO TABS
10.0000 mg | ORAL_TABLET | Freq: Every day | ORAL | Status: DC
  Administered 2013-06-23 – 2013-06-24 (×2): 10 mg via ORAL
  Filled 2013-06-23 (×6): qty 1

## 2013-06-23 MED ORDER — ARIPIPRAZOLE 5 MG PO TABS: 5.0000 mg | ORAL_TABLET | Freq: Every day | ORAL | Status: DC

## 2013-06-23 MED ORDER — HYDROXYZINE HCL 25 MG PO TABS: 25.0000 mg | ORAL_TABLET | Freq: Four times a day (QID) | ORAL | Status: DC | PRN

## 2013-06-23 MED ORDER — ARIPIPRAZOLE 5 MG PO TABS
2.5000 mg | ORAL_TABLET | Freq: Every day | ORAL | Status: DC
  Administered 2013-06-23 – 2013-06-24 (×2): 2.5 mg via ORAL
  Filled 2013-06-23 (×6): qty 1

## 2013-06-23 NOTE — [facility] Suicide Risk Assessment (Signed)
[facility] INPATIENT:  Family/Significant Other Suicide Prevention Education  Suicide Prevention Education:  Patient Refusal for Family/Significant Other Suicide Prevention Education: The patient [name] [name] has refused to provide written consent for family/significant other to be provided Family/Significant Other Suicide Prevention Education during admission and/or prior to discharge.  Physician notified.  Wynn BankerHodnett, Miaisabella Bacorn Hairston 06/23/2013, 11:21 AM

## 2013-06-23 NOTE — Progress Notes (Signed)
The focus of this group is to educate the patient on the purpose and policies of crisis stabilization and provide a format to answer questions about their admission.  The group details unit policies and expectations of patients while admitted.  Patient did not attend 0900 nurse education orientation group this morning.  Patient stayed in bed.   

## 2013-06-23 NOTE — Progress Notes (Signed)
Patient ID: [name] [name], male   DOB: 11-01-1960, 53 y.o.   MRN: [id number] [facility] MD Progress Note  06/23/2013 2:31 PM [name] [name]  MRN:  [id number] HPI:  Pt presented to [facility] from ED with complaints of auditory hallucinations with suicidal ideation. Patient appeared lying in his bed with the complaints of auditory hallucinations but cannot make out what they're saying and also reports neck pain. Patient reportedly slept good last night and eating okay. Patient continued to have suicide ideation without intention or plan. Patient reports ongoing paranoia, mood swings, depression and difficulty sleeping. He states that he is still hearing voices telling him to hurt himself but he has no plan to obey the voices. He is requesting for his antipsychotic medication to be increased for better control of auditory hallucinations. Patient verbalizes hopelessness due to being homeless and worry about lack of money. He is compliant with his current medications and has not endorsed any adverse reactions. Patient was encouraged to participate in milieu therapy and group therapies. Patient is in agreement with adjusting his medication risperidone and trazodone.  Assessment During today's assessment, pt reports anxiety at 4/10 and depression at 8/10. Pt denies SI, HI, and AVH, contracts for safety. Pt reports that the voices have resolved within the past 24 hours and that he has no feelings of SI since waking this morning. Pt is feeling very lethargic, lying down in bed most of the day. Pt states he has no energy, that he is still very depressed, but not as depressed and not suicidal. Pt also reports difficulty dealing with his anxiety, specifically in regard to discharge back to a homeless shelter. Pt denies other physical or psychological complaints at this time and is in agreement with the medication and treatment plan.   Diagnosis:   DSM5: Schizophrenia Disorders:  Schizophrenia (295.7)  Depressive Disorders:   Major Depressive Disorder - Severe (296.23), Mood disorder  Axis I: Chronic Paranoid Schizophrenia. Axis II: Deferred Axis III:  Past Medical History  Diagnosis Date  . Head Lice    Axis IV: other psychosocial or environmental problems, problems related to social environment and problems with primary support group Axis V: 41-50 serious symptoms  ADL's:  Intact  Sleep: Good  Appetite:  Fair  Suicidal Ideation: yes Denies Homicidal Ideation:  Denies   Psychiatric Specialty Exam: Review of Systems  Constitutional: Negative.   HENT: Negative.   Eyes: Negative.   Respiratory: Negative.   Cardiovascular: Negative.   Gastrointestinal: Negative.   Genitourinary: Negative.   Musculoskeletal: Positive for joint pain (Patient reports mild pain to left hand. ).  Skin: Negative.   Neurological: Negative.   Endo/Heme/Allergies: Negative.   Psychiatric/Behavioral: Positive for depression, suicidal ideas and hallucinations. Negative for memory loss and substance abuse.    Blood pressure 100/71, pulse 74, temperature 98 F (36.7 C), temperature source Oral, resp. rate 17, height $RemoveBe'5\' 7"'pTjtvEyRO$  (1.702 m), weight 73.029 kg (161 lb), SpO2 96.00%.Body mass index is 25.21 kg/(m^2).  General Appearance: Disheveled, nonmalodorous   Eye Contact::  Poor  Speech:  Normal  Volume:  Decreased  Mood:  Depressed  Affect:  Congruent  Thought Process:  Coherent  Orientation:  Full (Time, Place, and Person)  Thought Content: WDL, thinking about discharge to homeless shelter  Suicidal Thoughts:  No  Homicidal Thoughts:  No  Memory:  Immediate;   Fair Recent;   Fair Remote;   Fair  Judgement:  Poor  Insight:  Lacking  Psychomotor Activity:  Decreased  Concentration:  Fair  Recall:  Fair  Akathisia:  No  Handed:  Right  AIMS (if indicated):     Assets:  Leisure Time Physical Health Resilience  Sleep:  Number of Hours: 5.25   Current Medications: Current Facility-Administered Medications   Medication Dose Route Frequency Provider Last Rate Last Dose  . alum & mag hydroxide-simeth (MAALOX/MYLANTA) 200-200-20 MG/5ML suspension 30 mL  30 mL Oral Q4H PRN Waldon Merl, MD      . benztropine (COGENTIN) tablet 1 mg  1 mg Oral BID Waldon Merl, MD   1 mg at 06/23/13 0900  . divalproex (DEPAKOTE ER) 24 hr tablet 750 mg  750 mg Oral BID PC Mojeed Akintayo   750 mg at 06/23/13 0900  . ibuprofen (ADVIL,MOTRIN) tablet 600 mg  600 mg Oral Q6H PRN Waylan Boga, NP   600 mg at 06/22/13 0909  . magnesium hydroxide (MILK OF MAGNESIA) suspension 30 mL  30 mL Oral Daily PRN Waldon Merl, MD      . nicotine (NICODERM CQ - dosed in mg/24 hours) patch 21 mg  21 mg Transdermal Q0600 Durward Parcel, MD   21 mg at 06/23/13 2025  . risperiDONE (RISPERDAL) tablet 2 mg  2 mg Oral BID Durward Parcel, MD   2 mg at 06/23/13 0900  . traZODone (DESYREL) tablet 100 mg  100 mg Oral QHS Durward Parcel, MD        Lab Results:  Results for orders placed during the hospital encounter of 06/18/13 (from the past 48 hour(s))  VALPROIC ACID LEVEL     Status: None   Collection Time    06/23/13  6:15 AM      Result Value Ref Range   Valproic Acid Lvl 78.4  50.0 - 100.0 ug/mL   Comment: Performed at [facility]  CBC WITH DIFFERENTIAL     Status: Abnormal   Collection Time    06/23/13  6:15 AM      Result Value Ref Range   WBC 8.9  4.0 - 10.5 K/uL   RBC 4.28  4.22 - 5.81 MIL/uL   Hemoglobin 13.8  13.0 - 17.0 g/dL   HCT 39.7  39.0 - 52.0 %   MCV 92.8  78.0 - 100.0 fL   MCH 32.2  26.0 - 34.0 pg   MCHC 34.8  30.0 - 36.0 g/dL   RDW 13.0  11.5 - 15.5 %   Platelets 270  150 - 400 K/uL   Neutrophils Relative % 59  43 - 77 %   Neutro Abs 5.3  1.7 - 7.7 K/uL   Lymphocytes Relative 27  12 - 46 %   Lymphs Abs 2.4  0.7 - 4.0 K/uL   Monocytes Relative 7  3 - 12 %   Monocytes Absolute 0.6  0.1 - 1.0 K/uL   Eosinophils Relative 6 (*) 0 - 5 %   Eosinophils Absolute 0.6   0.0 - 0.7 K/uL   Basophils Relative 1  0 - 1 %   Basophils Absolute 0.1  0.0 - 0.1 K/uL   Comment: Performed at [facility]     Status: None   Collection Time    06/23/13  6:15 AM      Result Value Ref Range   Sodium 140  137 - 147 mEq/L   Potassium 4.5  3.7 - 5.3 mEq/L   Chloride 102  96 - 112 mEq/L   CO2 26  19 - 32 mEq/L   Glucose, Bld 87  70 - 99 mg/dL   BUN 16  6 - 23 mg/dL   Creatinine, Ser 0.90  0.50 - 1.35 mg/dL   Calcium 9.3  8.4 - 10.5 mg/dL   Total Protein 6.9  6.0 - 8.3 g/dL   Albumin 3.6  3.5 - 5.2 g/dL   AST 11  0 - 37 U/L   ALT 9  0 - 53 U/L   Alkaline Phosphatase 68  39 - 117 U/L   Total Bilirubin 0.3  0.3 - 1.2 mg/dL   GFR calc non Af Amer >90  >90 mL/min   GFR calc Af Amer >90  >90 mL/min   Comment: (NOTE)     The eGFR has been calculated using the CKD EPI equation.     This calculation has not been validated in all clinical situations.     eGFR's persistently <90 mL/min signify possible Chronic Kidney     Disease.     Performed at [facility]    Physical Findings: AIMS: Facial and Oral Movements Muscles of Facial Expression: None, normal Lips and Perioral Area: None, normal Jaw: None, normal Tongue: None, normal,Extremity Movements Upper (arms, wrists, hands, fingers): None, normal Lower (legs, knees, ankles, toes): None, normal, Trunk Movements Neck, shoulders, hips: None, normal, Overall Severity Severity of abnormal movements (highest score from questions above): None, normal Incapacitation due to abnormal movements: None, normal Patient's awareness of abnormal movements (rate only patient's report): No Awareness, Dental Status Current problems with teeth and/or dentures?: No Does patient usually wear dentures?: No  CIWA:  CIWA-Ar Total: 2 COWS:  COWS Total Score: 2  Treatment Plan Summary: Daily contact with patient to assess and evaluate symptoms and progress in  treatment Medication management  Plan:  Review of chart, vital signs, medications, and notes. 1-Individual and group therapy 2-Medication management :  Continue Depakote to $RemoveBef'750mg'OwfetQMPpj$  po BID for Bipolar, increase Risperdal 2 mg mg po BID for Psychosis,  decrease Trazodone to $RemoveBefo'100mg'TDYGDUzmvGd$  po Qhs for  insomnia.  Check valproic acid level, CBC with differential and comprehensive metabolic panel  -Add Abilify 2.$RemoveBefore'5mg'KjGdoOrVyjghK$  daily to increase energy levels and motivation (will keep low dt dual therapy) -Continue Risperdal same dosage (voices have resolved completely) -Add Celexa $RemoveBef'10mg'rWVVKDLpqf$  PO daily for depression -Add Vistaril $RemoveBefor'25mg'cRDLiukSxQYX$  q6h PRN for anxiety -Add Robaxin $RemoveBefo'750mg'xCMilenzCCO$  PO q8h for muscle spasms and possible C2-C3 area of nerve root compression (radiating, electric shock feelings across these dermatomes and moving into arm, consistent with improper sleep positioning of neck/pillow) -Continue ibuprofen as listed for aforementioned neck pain  3-Coping skills for depression, anxiety, and psychosis 4-Continue crisis stabilization and management 5-Address health issues--complained of neck pain, ibuprofen ordered 6-Treatment plan in progress to prevent relapse of depression, psychosis, and anxiety 7- Patient received treatment for head lice and has been cleared by ID to be taken off contact precautions.   Medical Decision Making Problem Points:  Established problem, improving (1) and Review of psycho-social stressors (1), New problem with workup (1)  Data Points:  Review or order clinical lab tests (1) Review of medication regiment & side effects (2) Review of new medications or change in dosage (2)  I certify that inpatient services furnished can reasonably be expected to improve the patient's condition.   Benjamine Mola, FNP-BC 06/23/2013, 2:31 PM  Reviewed the information documented and agree with the treatment plan.  Deklyn Trachtenberg,JANARDHAHA R. 06/23/2013 6:01 PM

## 2013-06-23 NOTE — Progress Notes (Addendum)
D:  Patient's self inventory sheet, patient has poor sleep, improving appetite, low energy level, poor attention span.  Rated depression and hopeless #10.  Denied withdrawals.  Denied SI.  Neck pain is only physical problem.  Zero pain goal, worst pain #10.  Plans to discharge to shelter.  Needs financial assistance to purchase medications after discharge. A:  Medications administered per MD orders.  Emotional support and encouragement given patient. R:  Denied SI and HI.  Denied A/V hallucinations.  Will continue to monitor patient for safety with 15 minute checks.  Safety maintained.

## 2013-06-23 NOTE — Progress Notes (Signed)
Recreation Therapy Notes  Date: 02.16.2015 Time: 2:45pm Location: [address] Dayroom   Group Topic: Wellness  Goal Area(s) Addresses:  Patient will define components of whole wellness. Patient will verbalize benefit of whole wellness. Patient will identified how neglecting wellness exacerbates depression/anxiety/substance abuse/ etc.  Behavioral Response: Did not attend.   Marykay Lexenise L Genene Kilman, LRT/CTRS  Honestie Kulik L 06/23/2013 8:55 AM

## 2013-06-23 NOTE — Clinical Social Work Note (Signed)
Writer [facility] with [facility] who advised patient needs to continue calling daily to let them know he is in the hospital.  She advised they will take care of him at discharge.

## 2013-06-23 NOTE — [facility] Group Notes (Signed)
[facility] LCSW Group Therapy  Feelings Around Diagnosis 1:15 - 2:30 PM  06/23/2013 2:50 PM  Type of Therapy:  Group Therapy  Participation Level:  Did Not Attend  Wynn BankerHodnett, Yehoshua Vitelli Hairston 06/23/2013, 2:50 PM

## 2013-06-23 NOTE — Progress Notes (Signed)
Patient ID: [name] [name], male   DOB: 1960-12-10, 53 y.o.   MRN: [id number] D: Pt. In bed, eyes closed, respirations even. A: Writer observed for s/s of distress. R: No distress noted. Respirations unlabored.

## 2013-06-23 NOTE — Progress Notes (Signed)
D:  Patient in his room in bed awake on approach.  Patient states she had a bad day because she is depressed.  Patient appears flat and depressed.  Patient states he has tried to attend group but states he is unable to focus and has to get up and leave groups frequently.  Patient denies SI/HI but states he is having auditory hallucinations.  Patient states he hears children's voices mumbling and cannot make out what they are saying.  Patient verbally contracts for safety. A: Staff to monitor Q 15 mins for safety.  Encouragement and support offered.  No Scheduled medications administered.  Patient refused scheduled trazodone tonight. R: Patient remains safe on the unit.  Patient not visible on the unit tonight.  Patient refused Trazodone tonight.  NO medications to be administered tonight.

## 2013-06-24 MED ORDER — TRAZODONE HCL 50 MG PO TABS
50.0000 mg | ORAL_TABLET | Freq: Every day | ORAL | Status: DC
  Administered 2013-06-24: 50 mg via ORAL
  Filled 2013-06-24 (×2): qty 1

## 2013-06-24 MED ORDER — CITALOPRAM HYDROBROMIDE 20 MG PO TABS
20.0000 mg | ORAL_TABLET | Freq: Every day | ORAL | Status: DC
  Administered 2013-06-25: 20 mg via ORAL
  Filled 2013-06-24 (×2): qty 1

## 2013-06-24 MED ORDER — RISPERIDONE 2 MG PO TABS
2.5000 mg | ORAL_TABLET | Freq: Two times a day (BID) | ORAL | Status: DC
  Administered 2013-06-24 – 2013-06-25 (×3): 2.5 mg via ORAL
  Filled 2013-06-24 (×6): qty 1

## 2013-06-24 NOTE — Progress Notes (Signed)
Patient ID: [name] [name], male   DOB: 1960/05/17, 53 y.o.   MRN: [id number] Patient ID: [name] [name], male   DOB: 25-Nov-1960, 53 y.o.   MRN: [id number] [facility] MD Progress Note  06/24/2013 2:42 PM [name] [name]  MRN:  [id number] HPI:  Patient was admitted to [facility] from ED visit the diagnosis of paranoid psychosis and diagnosis of schizophrenia with cannabis abuse. Patient presented with auditory hallucinations with suicidal ideation. He Alsoo reports neck pain. Patient reports ongoing paranoia, mood swings, depression and difficulty sleeping.   Assessment During today's assessment,  patient complaining about feeling drowsy and sleepy and not able to participate much in group activities. Patient reported feeling more depressed at) and anxiety 10/10 patient has a flat affect and soft speech. Patient denied current suicidal or homicidal ideation intentions or plan. Patient has contracted for safety.  he is feeling very lethargic, lying down in bed most of the day. Patient has concerns about his discharge back to a homeless shelter.  He is in agreement with the medication and treatment plan.   Diagnosis:   DSM5: Schizophrenia Disorders:  Schizophrenia (295.7)  Depressive Disorders:  Major Depressive Disorder - Severe (296.23), Mood disorder  Axis I: Chronic Paranoid Schizophrenia. Axis II: Deferred Axis III:  Past Medical History  Diagnosis Date  . Head Lice    Axis IV: other psychosocial or environmental problems, problems related to social environment and problems with primary support group Axis V: 41-50 serious symptoms  ADL's:  Intact  Sleep: Good  Appetite:  Fair  Suicidal Ideation: yes Denies Homicidal Ideation:  Denies   Psychiatric Specialty Exam: Review of Systems  Constitutional: Negative.   HENT: Negative.   Eyes: Negative.   Respiratory: Negative.   Cardiovascular: Negative.   Gastrointestinal: Negative.   Genitourinary: Negative.   Musculoskeletal: Positive for  joint pain (Patient reports mild pain to left hand. ).  Skin: Negative.   Neurological: Negative.   Endo/Heme/Allergies: Negative.   Psychiatric/Behavioral: Positive for depression, suicidal ideas and hallucinations. Negative for memory loss and substance abuse.    Blood pressure 127/68, pulse 76, temperature 97.4 F (36.3 C), temperature source Oral, resp. rate 18, height $RemoveBe'5\' 7"'nOGJXbmOs$  (1.702 m), weight 73.029 kg (161 lb), SpO2 96.00%.Body mass index is 25.21 kg/(m^2).  General Appearance: Disheveled, nonmalodorous   Eye Contact::  Poor  Speech:  Normal  Volume:  Decreased  Mood:  Depressed  Affect:  Congruent  Thought Process:  Coherent  Orientation:  Full (Time, Place, and Person)  Thought Content: WDL, thinking about discharge to homeless shelter  Suicidal Thoughts:  No  Homicidal Thoughts:  No  Memory:  Immediate;   Fair Recent;   Fair Remote;   Fair  Judgement:  Poor  Insight:  Lacking  Psychomotor Activity:  Decreased  Concentration:  Fair  Recall:  Fair  Akathisia:  No  Handed:  Right  AIMS (if indicated):     Assets:  Leisure Time Physical Health Resilience  Sleep:  Number of Hours: 4   Current Medications: Current Facility-Administered Medications  Medication Dose Route Frequency Provider Last Rate Last Dose  . alum & mag hydroxide-simeth (MAALOX/MYLANTA) 200-200-20 MG/5ML suspension 30 mL  30 mL Oral Q4H PRN Waldon Merl, MD      . ARIPiprazole (ABILIFY) tablet 2.5 mg  2.5 mg Oral Daily Benjamine Mola, FNP   2.5 mg at 06/24/13 6387  . benztropine (COGENTIN) tablet 1 mg  1 mg Oral BID Waldon Merl, MD   1  mg at 06/24/13 0824  . citalopram (CELEXA) tablet 10 mg  10 mg Oral Daily Benjamine Mola, FNP   10 mg at 06/24/13 6283  . divalproex (DEPAKOTE ER) 24 hr tablet 750 mg  750 mg Oral BID PC Mojeed Akintayo   750 mg at 06/24/13 0825  . hydrOXYzine (ATARAX/VISTARIL) tablet 25 mg  25 mg Oral Q6H PRN Benjamine Mola, FNP      . ibuprofen (ADVIL,MOTRIN) tablet 600 mg   600 mg Oral Q6H PRN Waylan Boga, NP   600 mg at 06/23/13 1443  . magnesium hydroxide (MILK OF MAGNESIA) suspension 30 mL  30 mL Oral Daily PRN Waldon Merl, MD      . methocarbamol (ROBAXIN) tablet 750 mg  750 mg Oral Q8H PRN Benjamine Mola, FNP   750 mg at 06/23/13 1716  . nicotine (NICODERM CQ - dosed in mg/24 hours) patch 21 mg  21 mg Transdermal Q0600 Durward Parcel, MD   21 mg at 06/24/13 0824  . risperiDONE (RISPERDAL) tablet 2 mg  2 mg Oral BID Durward Parcel, MD   2 mg at 06/24/13 1517  . traZODone (DESYREL) tablet 100 mg  100 mg Oral QHS Durward Parcel, MD        Lab Results:  Results for orders placed during the hospital encounter of 06/18/13 (from the past 48 hour(s))  VALPROIC ACID LEVEL     Status: None   Collection Time    06/23/13  6:15 AM      Result Value Ref Range   Valproic Acid Lvl 78.4  50.0 - 100.0 ug/mL   Comment: Performed at [facility]  CBC WITH DIFFERENTIAL     Status: Abnormal   Collection Time    06/23/13  6:15 AM      Result Value Ref Range   WBC 8.9  4.0 - 10.5 K/uL   RBC 4.28  4.22 - 5.81 MIL/uL   Hemoglobin 13.8  13.0 - 17.0 g/dL   HCT 39.7  39.0 - 52.0 %   MCV 92.8  78.0 - 100.0 fL   MCH 32.2  26.0 - 34.0 pg   MCHC 34.8  30.0 - 36.0 g/dL   RDW 13.0  11.5 - 15.5 %   Platelets 270  150 - 400 K/uL   Neutrophils Relative % 59  43 - 77 %   Neutro Abs 5.3  1.7 - 7.7 K/uL   Lymphocytes Relative 27  12 - 46 %   Lymphs Abs 2.4  0.7 - 4.0 K/uL   Monocytes Relative 7  3 - 12 %   Monocytes Absolute 0.6  0.1 - 1.0 K/uL   Eosinophils Relative 6 (*) 0 - 5 %   Eosinophils Absolute 0.6  0.0 - 0.7 K/uL   Basophils Relative 1  0 - 1 %   Basophils Absolute 0.1  0.0 - 0.1 K/uL   Comment: Performed at [place]     Status: None   Collection Time    06/23/13  6:15 AM      Result Value Ref Range   Sodium 140  137 - 147 mEq/L   Potassium 4.5  3.7 - 5.3 mEq/L    Chloride 102  96 - 112 mEq/L   CO2 26  19 - 32 mEq/L   Glucose, Bld 87  70 - 99 mg/dL   BUN 16  6 - 23 mg/dL   Creatinine, Ser 0.90  0.50 - 1.35 mg/dL   Calcium 9.3  8.4 - 10.5 mg/dL   Total Protein 6.9  6.0 - 8.3 g/dL   Albumin 3.6  3.5 - 5.2 g/dL   AST 11  0 - 37 U/L   ALT 9  0 - 53 U/L   Alkaline Phosphatase 68  39 - 117 U/L   Total Bilirubin 0.3  0.3 - 1.2 mg/dL   GFR calc non Af Amer >90  >90 mL/min   GFR calc Af Amer >90  >90 mL/min   Comment: (NOTE)     The eGFR has been calculated using the CKD EPI equation.     This calculation has not been validated in all clinical situations.     eGFR's persistently <90 mL/min signify possible Chronic Kidney     Disease.     Performed at [facility]    Physical Findings: AIMS: Facial and Oral Movements Muscles of Facial Expression: None, normal Lips and Perioral Area: None, normal Jaw: None, normal Tongue: None, normal,Extremity Movements Upper (arms, wrists, hands, fingers): None, normal Lower (legs, knees, ankles, toes): None, normal, Trunk Movements Neck, shoulders, hips: None, normal, Overall Severity Severity of abnormal movements (highest score from questions above): None, normal Incapacitation due to abnormal movements: None, normal Patient's awareness of abnormal movements (rate only patient's report): No Awareness, Dental Status Current problems with teeth and/or dentures?: No Does patient usually wear dentures?: No  CIWA:  CIWA-Ar Total: 1 COWS:  COWS Total Score: 1  Treatment Plan Summary: Daily contact with patient to assess and evaluate symptoms and progress in treatment Medication management  Plan:  Review of chart, vital signs, medications, and notes. 1-Individual and group therapy 2-Medication management :  Continue Depakote to 750mg  po BID for Bipolar,  Increase Risperdal 2.5 mg mg po BID for Psychosis,   Continue Trazodone to 100mg  po Qhs for  insomnia.  Check valproic acid level, CBC  with differential and comprehensive metabolic panel  Discontinue Abilify 2.5mg  daily to increase energy levels and motivation not helpful Continue Celexa 10mg  PO daily for depression Discontinue Vistaril 25mg  q6h PRN for anxiety Continue Robaxin 750mg  PO q8h for muscle spasms and possible C2-C3 area of nerve root compression (radiating, electric shock feelings across these dermatomes and moving into arm, consistent with improper sleep positioning of neck/pillow) Continue ibuprofen as listed for aforementioned neck pain  3-Coping skills for depression, anxiety, and psychosis 4-Continue crisis stabilization and management 5-Address health issues--complained of neck pain, ibuprofen ordered 6-Treatment plan in progress to prevent relapse of depression, psychosis, and anxiety 7- Patient received treatment for head lice and has been cleared by ID to be taken off contact precautions.   Medical Decision Making Problem Points:  Established problem, improving (1) and Review of psycho-social stressors (1), New problem with workup (1)  Data Points:  Review or order clinical lab tests (1) Review of medication regiment & side effects (2) Review of new medications or change in dosage (2)  I certify that inpatient services furnished can reasonably be expected to improve the patient's condition.   Sevyn Paredez,JANARDHAHA R. 06/24/2013 2:42 PM

## 2013-06-24 NOTE — [facility] Group Notes (Signed)
[facility]  Emotional Regulation 1:15 - 2:30 PM  06/24/2013 3:01 PM  Type of Therapy:  Group Therapy  Participation Level:  Did Not Attend  Wynn BankerHodnett, [name] [name] 06/24/2013, 3:01 PM

## 2013-06-24 NOTE — Progress Notes (Signed)
Adult Psychoeducational Group Note  Date:  06/24/2013 Time:  10:00am Group Topic/Focus:  Personal Choices and Values:   The focus of this group is to help patients assess and explore the importance of values in their lives, how their values affect their decisions, how they express their values and what opposes their expression.  Participation Level:  Did Not Attend  Participation Quality:    Affect:    Cognitive:    Insight:   Engagement in Group:   Modes of Intervention:    Additional Comments:  Pt did not attend group  Shelly BombardGarner, Janard Culp D 06/24/2013, 10:40 AM

## 2013-06-24 NOTE — [facility] Group Notes (Addendum)
[facility]   06/24/2013 9:41 AM    Participation Quality:  Appropraite  Mood/Affect:  Appropriate  Depression Rating:  10  Anxiety Rating:  5/6  Thoughts of Suicide:  Yes  Will you contract for safety?   Yes, patient is able to contract for safety.  Current AVH:  No  Plan for Discharge/Comments:  Patient attended discharge planning group and actively participated in group.  [facility].  [facility] .  CSW provided all participants with daily workbook.   Transportation Means: Patient has transportation.   Supports:  Patient has a support system.   Oshae Simmering, Joesph JulyQuylle Hairston

## 2013-06-24 NOTE — Progress Notes (Signed)
Patient ID: [name] [name], male   DOB: 1961/03/01, 53 y.o.   MRN: [id number] Client stayed in bed the entire shift; out for meals and medication. He is quiet, guarded, and isolative; he also does not appear to be able to handle the stimuli of this environment. He appears to be internally preoccupied. Affect is flat with poverty of speech.

## 2013-06-24 NOTE — Progress Notes (Addendum)
(  D) Patient presents as disheveled, flat affect, irritable mood. Patient did attend LCSW led group for a few minutes before he left to return to his room. Patient denies voices this morning. He did not attend psycho-ed group this morning. (A) Patient encouraged look through his daily workbook and ask questions if needed. (R) Safety maintained on the unit.  Patient declined to complete daily self inventory.

## 2013-06-24 NOTE — Progress Notes (Signed)
Recreation Therapy Notes  Date: 02.18.2015 Time: 2:45pm Location: [address] Dayroom    Group Topic: Boundaries  Goal Area(s) Addresses:  Patient will identify benefit of establishing healthy boundaries.  Patient will identify what is preventing establishing healthy boundaries.   Behavioral Response: Did not attend.   Marykay Lexenise L Toi Stelly, LRT/CTRS   Jearl KlinefelterBlanchfield, Manoj Enriquez L 06/24/2013 7:34 PM

## 2013-06-24 NOTE — Progress Notes (Signed)
Adult Psychoeducational Group Note  Date:  06/24/2013 Time:  9:12 PM  Group Topic/Focus:  Wrap-Up Group:   The focus of this group is to help patients review their daily goal of treatment and discuss progress on daily workbooks.  Participation Level:  Minimal  Participation Quality:  Appropriate  Affect:  Appropriate  Cognitive:  Appropriate  Insight: Appropriate  Engagement in Group:  Engaged  Modes of Intervention:  Support  Additional Comments:  Pt stated that nothing positive happened today but he was willing to share his favorite color.   [name] 06/24/2013, 9:12 PM

## 2013-06-24 NOTE — Tx Team (Signed)
Interdisciplinary Treatment Plan Update   Date Reviewed:  06/24/2013  Time Reviewed:  10:27 AM  Progress in Treatment:   Attending groups: Yes Participating in groups: Yes Taking medication as prescribed: Yes  Tolerating medication: Yes Family/Significant other contact made:  No, but will ask patient for consent for collateral contacts. Patient understands diagnosis: Yes  Discussing patient identified problems/goals with staff: Yes Medical problems stabilized or resolved: Yes Denies suicidal/homicidal ideation: Yes Patient has not harmed self or others: Yes  For review of initial/current patient goals, please see plan of care.  Estimated Length of Stay: 1 day  Reasons for Continued Hospitalization:  Anxiety Depression Medication stabilization  New Problems/Goals identified:    Discharge Plan or Barriers:   [facility]  Additional Comments:  N/A  Attendees:  Patient:  06/24/2013 10:27 AM   Signature: Mervyn GayJ. Jonnalagadda, MD 06/24/2013 10:27 AM  Signature:   06/24/2013 10:27 AM  Signature:  Claudette Headonrad Withrow, NP 06/24/2013 10:27 AM  Signature: Barrie Folkawn Placke, RN 06/24/2013 10:27 AM  Signature:   06/24/2013 10:27 AM  Signature:  Juline PatchQuylle Elsie Sakuma, LCSW 06/24/2013 10:27 AM  Signature:  Reyes Ivanhelsea Horton, LCSW 06/24/2013 10:27 AM  Signature:   06/24/2013 10:27 AM  Signature:  Aloha GellKrista Dopson, RN 06/24/2013 10:27 AM  Signature: 06/24/2013  10:27 AM  Signature:   Onnie BoerJennifer Clark, RN [facility] 06/24/2013  10:27 AM  Signature:   06/24/2013  10:27 AM    Scribe for Treatment Team:   Juline PatchQuylle Emmalyn Hinson,  06/24/2013 10:27 AM

## 2013-06-25 DIAGNOSIS — F332 Major depressive disorder, recurrent severe without psychotic features: Secondary | ICD-10-CM

## 2013-06-25 DIAGNOSIS — F1210 Cannabis abuse, uncomplicated: Secondary | ICD-10-CM

## 2013-06-25 DIAGNOSIS — F200 Paranoid schizophrenia: Principal | ICD-10-CM

## 2013-06-25 MED ORDER — RISPERIDONE 0.5 MG PO TABS: 2.5000 mg | ORAL_TABLET | Freq: Two times a day (BID) | ORAL | Status: DC

## 2013-06-25 MED ORDER — RISPERIDONE 1 MG PO TABS
2.5000 mg | ORAL_TABLET | Freq: Two times a day (BID) | ORAL | Status: DC
  Filled 2013-06-25 (×2): qty 70

## 2013-06-25 MED ORDER — TRAZODONE HCL 50 MG PO TABS
50.0000 mg | ORAL_TABLET | Freq: Every evening | ORAL | Status: DC | PRN
Start: 2013-06-25 — End: 2013-08-13

## 2013-06-25 MED ORDER — METHOCARBAMOL 750 MG PO TABS: 750.0000 mg | ORAL_TABLET | Freq: Three times a day (TID) | ORAL | Status: DC | PRN

## 2013-06-25 MED ORDER — BENZTROPINE MESYLATE 1 MG PO TABS: 1.0000 mg | ORAL_TABLET | Freq: Two times a day (BID) | ORAL | Status: DC

## 2013-06-25 MED ORDER — DIVALPROEX SODIUM ER 250 MG PO TB24
750.0000 mg | ORAL_TABLET | Freq: Two times a day (BID) | ORAL | Status: DC
  Filled 2013-06-25 (×2): qty 84

## 2013-06-25 MED ORDER — DIVALPROEX SODIUM ER 250 MG PO TB24: 750.0000 mg | ORAL_TABLET | Freq: Two times a day (BID) | ORAL | Status: DC

## 2013-06-25 MED ORDER — CITALOPRAM HYDROBROMIDE 20 MG PO TABS: 20.0000 mg | ORAL_TABLET | Freq: Every day | ORAL | Status: DC

## 2013-06-25 NOTE — Progress Notes (Signed)
The focus of this group is to educate the patient on the purpose and policies of crisis stabilization and provide a format to answer questions about their admission.  The group details unit policies and expectations of patients while admitted.  Patient attended 0900 nurse education orientation group this morning.  Patient listened attentively, appropriate affect, alert, appropriate insight and engagement.  Today patient will work on 3 goals for discharge.  

## 2013-06-25 NOTE — [facility] Group Notes (Signed)
[facility] LCSW Group Therapy  Living A Balanced Life  1:15 - 2: 30          06/25/2013    Type of Therapy:  Group Therapy  Participation Level:  Did not attend group.  Wynn BankerHodnett, Arie Gable Hairston 06/25/2013

## 2013-06-25 NOTE — Discharge Summary (Signed)
Physician Discharge Summary Note  Patient:  [name] [name] is an 53 y.o., male MRN:  [id number] DOB:  Sep 27, 1960 Patient phone:  [phone] (home)  Patient address:   [address] [place] [address],  Total Time spent with patient: Greater than 25 minutes  Date of Admission:  06/18/2013 Date of Discharge: 06/25/2013  Reason for Admission:  MDD   Discharge Diagnoses: Active Problems:   Paranoid schizophrenia, chronic condition   Cannabis abuse   Psychiatric Specialty Exam: Physical Exam  Review of Systems  Constitutional: Negative.   HENT: Negative.   Eyes: Negative.   Respiratory: Negative.   Cardiovascular: Negative.   Gastrointestinal: Negative.   Genitourinary: Negative.   Musculoskeletal: Negative.   Skin: Negative.   Neurological: Negative.   Endo/Heme/Allergies: Negative.   Psychiatric/Behavioral: Positive for depression. The patient is nervous/anxious.     Blood pressure 113/75, pulse 76, temperature 97.6 F (36.4 C), temperature source Oral, resp. rate 16, height _0  (1.702 m), weight 73.029 kg (161 lb), SpO2 96.00%.Body mass index is 25.21 kg/(m^2).  General Appearance: Disheveled  Eye Sport and exercise psychologist::  Fair  Speech:  Clear and Coherent  Volume:  Decreased  Mood:  Depressed  Affect:  Appropriate and Depressed  Thought Process:  Coherent  Orientation:  Full (Time, Place, and Person)  Thought Content:  WDL  Suicidal Thoughts:  No  Homicidal Thoughts:  No  Memory:  Immediate;   Good Recent;   Good Remote;   Good  Judgement:  Fair  Insight:  Fair  Psychomotor Activity:  Decreased  Concentration:  Good  Recall:  [place] of Knowledge:Good  Language: Good  Akathisia:  NA  Handed:  Right  AIMS (if indicated):     Assets:  Communication Skills Desire for Improvement Resilience  Sleep:  Number of Hours: 2.75   Musculoskeletal: Strength & Muscle Tone: within normal limits Gait & Station: normal Patient leans: N/A  DSM5:  Schizophrenia  Disorders:  Schizophrenia (295.7) Depressive Disorders:  Major Depressive Disorder - with Psychotic Features (296.24)  Axis Diagnosis:   AXIS I:  Major Depression, Recurrent severe, Paranoid Schizophrenia AXIS II:  Deferred AXIS III:   Past Medical History  Diagnosis Date  . Paranoid schizophrenia    AXIS IV:  economic problems, educational problems, housing problems, occupational problems, other psychosocial or environmental problems, problems related to legal system/crime, problems related to social environment, problems with access to health care services and problems with primary support group AXIS V:  61-70 mild symptoms  Level of Care:  OP  Hospital Course:  [name] [name] is a 53 year old male who was admitted voluntarily and emergently [facility] with the depression and feeling suicidal ideation with a plan of jumping out of the bridge. Patient reported that he has been homeless and has been living in [facility] and has been noncompliant with the medication because his medication is making him feel not good. Patient reported he continued to have a paranoid thoughts and suicidal ideation. Patient also reported hearing voices telling him to kill himself. Patient has been very agitated, labile and responding to internal stimuli. Patient states "I wanted to kill myself. I am tired of everything. Patient Has Been Taking His Medication since 2900 and Medications Don't Don't Work Any Longer for Him. Patient does report has been smoking marijuana. He have no support. The patient is a poor historian and is unable to remember which medications he took in the past.   During Hospitalization: Medications managed, psychoeducation, group and  individual therapy. Pt currently denies SI, HI, and Psychosis since waking. At discharge, pt rates anxiety/depression at 7/10, but this appears to be baseline for pt and this is chronic. Pt states that he does not have a good supportive home environment and will  followup with outpatient treatment. Pt will be living in a shelter and will arrange transportation to and from outpatient appointments. Affirms agreement with medication regimen and discharge plan. Denies other physical and psychological concerns at time of discharge.    Consults:  None  Significant Diagnostic Studies:  None  Discharge Vitals:   Blood pressure 113/75, pulse 76, temperature 97.6 F (36.4 C), temperature source Oral, resp. rate 16, height _0  (1.702 m), weight 73.029 kg (161 lb), SpO2 96.00%. Body mass index is 25.21 kg/(m^2). Lab Results:   Results for orders placed during the hospital encounter of 06/18/13 (from the past 72 hour(s))  VALPROIC ACID LEVEL     Status: None   Collection Time    06/23/13  6:15 AM      Result Value Ref Range   Valproic Acid Lvl 78.4  50.0 - 100.0 ug/mL   Comment: Performed at [facility]  CBC WITH DIFFERENTIAL     Status: Abnormal   Collection Time    06/23/13  6:15 AM      Result Value Ref Range   WBC 8.9  4.0 - 10.5 K/uL   RBC 4.28  4.22 - 5.81 MIL/uL   Hemoglobin 13.8  13.0 - 17.0 g/dL   HCT 39.7  39.0 - 52.0 %   MCV 92.8  78.0 - 100.0 fL   MCH 32.2  26.0 - 34.0 pg   MCHC 34.8  30.0 - 36.0 g/dL   RDW 13.0  11.5 - 15.5 %   Platelets 270  150 - 400 K/uL   Neutrophils Relative % 59  43 - 77 %   Neutro Abs 5.3  1.7 - 7.7 K/uL   Lymphocytes Relative 27  12 - 46 %   Lymphs Abs 2.4  0.7 - 4.0 K/uL   Monocytes Relative 7  3 - 12 %   Monocytes Absolute 0.6  0.1 - 1.0 K/uL   Eosinophils Relative 6 (*) 0 - 5 %   Eosinophils Absolute 0.6  0.0 - 0.7 K/uL   Basophils Relative 1  0 - 1 %   Basophils Absolute 0.1  0.0 - 0.1 K/uL   Comment: Performed at [facility]     Status: None   Collection Time    06/23/13  6:15 AM      Result Value Ref Range   Sodium 140  137 - 147 mEq/L   Potassium 4.5  3.7 - 5.3 mEq/L   Chloride 102  96 - 112 mEq/L   CO2 26  19 - 32 mEq/L   Glucose,  Bld 87  70 - 99 mg/dL   BUN 16  6 - 23 mg/dL   Creatinine, Ser 0.90  0.50 - 1.35 mg/dL   Calcium 9.3  8.4 - 10.5 mg/dL   Total Protein 6.9  6.0 - 8.3 g/dL   Albumin 3.6  3.5 - 5.2 g/dL   AST 11  0 - 37 U/L   ALT 9  0 - 53 U/L   Alkaline Phosphatase 68  39 - 117 U/L   Total Bilirubin 0.3  0.3 - 1.2 mg/dL   GFR calc non Af Amer >90  >90 mL/min   GFR calc  Af Amer >90  >90 mL/min   Comment: (NOTE)     The eGFR has been calculated using the CKD EPI equation.     This calculation has not been validated in all clinical situations.     eGFR's persistently <90 mL/min signify possible Chronic Kidney     Disease.     Performed at [facility]    Physical Findings: AIMS: Facial and Oral Movements Muscles of Facial Expression: None, normal Lips and Perioral Area: None, normal Jaw: None, normal Tongue: None, normal,Extremity Movements Upper (arms, wrists, hands, fingers): None, normal Lower (legs, knees, ankles, toes): None, normal, Trunk Movements Neck, shoulders, hips: None, normal, Overall Severity Severity of abnormal movements (highest score from questions above): None, normal Incapacitation due to abnormal movements: None, normal Patient's awareness of abnormal movements (rate only patient's report): No Awareness, Dental Status Current problems with teeth and/or dentures?: No Does patient usually wear dentures?: No  CIWA:  CIWA-Ar Total: 1 COWS:  COWS Total Score: 1  Psychiatric Specialty Exam: See Psychiatric Specialty Exam and Suicide Risk Assessment completed by Attending Physician prior to discharge.  Discharge destination:  Other:  Home to shelter.  Is patient on multiple antipsychotic therapies at discharge:  No   Has Patient had three or more failed trials of antipsychotic monotherapy by history:  No  Recommended Plan for Multiple Antipsychotic Therapies: NA     Medication List    STOP taking these medications       haloperidol 10 MG tablet   Commonly known as:  HALDOL      TAKE these medications     Indication   benztropine 1 MG tablet  Commonly known as:  COGENTIN  Take 1 tablet (1 mg total) by mouth 2 (two) times daily.   Indication:  Extrapyramidal Reaction caused by Medications     citalopram 20 MG tablet  Commonly known as:  CELEXA  Take 1 tablet (20 mg total) by mouth daily.   Indication:  mood stabilization     divalproex 250 MG 24 hr tablet  Commonly known as:  DEPAKOTE ER  Take 3 tablets (750 mg total) by mouth 2 (two) times daily after a meal.   Indication:  Mood lability     methocarbamol 750 MG tablet  Commonly known as:  ROBAXIN  Take 1 tablet (750 mg total) by mouth every 8 (eight) hours as needed for muscle spasms.   Indication:  Musculoskeletal Pain     risperiDONE 0.5 MG tablet  Commonly known as:  RISPERDAL  Take 5 tablets (2.5 mg total) by mouth 2 (two) times daily.   Indication:  mood stabilization     traZODone 50 MG tablet  Commonly known as:  DESYREL  Take 1 tablet (50 mg total) by mouth at bedtime as needed for sleep.   Indication:  Trouble Sleeping           Follow-up Information   Follow up with [facility]. (Please go to [facility] walk in clinic on Friday, June 26, 2013 or any weekday between [place] for medication managment.)    Contact information:   [address] [place],    [address]  [phone]      Follow-up recommendations:  Activity:  As tolerated Diet:  Heart healthy with low sodium.  Comments:   Take all medications as prescribed. Keep all follow-up appointments as scheduled.  Do not consume alcohol or use illegal drugs while on prescription medications. Report any adverse effects from your  medications to your primary care provider promptly.  In the event of recurrent symptoms or worsening symptoms, call 911, a crisis hotline, or go to the nearest emergency department for evaluation.   Total Discharge Time:  Greater than 30  minutes.  Signed: Benjamine Mola, FNP-BC 06/25/2013, 12:18 PM  Patient was seen for psych evaluation, suicide risk assessment and case discussed with treatment team and physician extender and developed treatment plan. Reviewed the information documented and agree with the treatment plan.   Shanaya Schneck,JANARDHAHA R. 06/26/2013 8:55 AM

## 2013-06-25 NOTE — Progress Notes (Signed)
[facility] :  Will you be returning to the same living situation after discharge: Yes,  Patient is returning to previous living arrangement At discharge, do you have transportation home?:Yes,  Patient assisted with bus pass. Do you have the ability to pay for your medications:Yes,  Patient has Medicare and Medicaid.  Release of information consent forms completed and in the chart;  Patient's signature needed at discharge.  Patient to Follow up at: Follow-up Information   [facility] (Please go to [facility] walk in clinic on Friday, June 26, 2013 or any weekday between 8AM - 3PM for medication managment.)    Contact information:   [address] [place]   [id number]  [phone]      Patient denies SI/HI:   Patient no longer endorsing SI/HI or other thoughts of self harm.     Safety Planning and Suicide Prevention discussed: .Reviewed with all patients during discharge planning group   Ruweyda Macknight, Joesph JulyQuylle Hairston 06/25/2013, 3:07 PM

## 2013-06-25 NOTE — [facility] Suicide Risk Assessment (Signed)
   Demographic Factors:  Male, Adolescent or young adult, Caucasian, Low socioeconomic status, Living alone and Unemployed  Total Time spent with patient: 30 minutes  Psychiatric Specialty Exam: Physical Exam  Constitutional: He is oriented to person, place, and time. He appears well-developed.  HENT:  Head: Normocephalic.  Eyes: Pupils are equal, round, and reactive to light.  Neck: Normal range of motion.  Cardiovascular: Normal rate.   Respiratory: Effort normal.  GI: Soft.  Musculoskeletal: Normal range of motion.  Neurological: He is alert and oriented to person, place, and time.  Skin: Skin is warm.    Review of Systems  All other systems reviewed and are negative.    Blood pressure 113/75, pulse 76, temperature 97.6 F (36.4 C), temperature source Oral, resp. rate 16, height 5\' 7"  (1.702 m), weight 73.029 kg (161 lb), SpO2 96.00%.Body mass index is 25.21 kg/(m^2).  General Appearance: Disheveled  Eye Contact::  Good  Speech:  Clear and Coherent  Volume:  Normal  Mood:  Euthymic  Affect:  Constricted and Depressed  Thought Process:  Goal Directed and Intact  Orientation:  Full (Time, Place, and Person)  Thought Content:  WDL  Suicidal Thoughts:  No  Homicidal Thoughts:  No  Memory:  Immediate;   Fair  Judgement:  Fair  Insight:  Fair  Psychomotor Activity:  Psychomotor Retardation  Concentration:  Good  Recall:  Dudley MajorGood  Fund of Knowledge:Fair  Language: Fair  Akathisia:  NA  Handed:  Right  AIMS (if indicated):     Assets:  Communication Skills Desire for Improvement Leisure Time Physical Health Resilience  Sleep:  Number of Hours: 2.75    Musculoskeletal: Strength & Muscle Tone: within normal limits Gait & Station: normal Patient leans: N/A   Mental Status Per Nursing Assessment::   On Admission:  Self-harm thoughts  Current Mental Status by Physician: NA  Loss Factors: Financial problems/change in socioeconomic status  Historical  Factors: Family history of mental illness or substance abuse and Impulsivity  Risk Reduction Factors:   Sense of responsibility to family, Religious beliefs about death, Positive social support, Positive therapeutic relationship and Positive coping skills or problem solving skills  Continued Clinical Symptoms:  Alcohol/Substance Abuse/Dependencies Schizophrenia:   Paranoid or undifferentiated type  Cognitive Features That Contribute To Risk:  Polarized thinking    Suicide Risk:  Minimal: No identifiable suicidal ideation.  Patients presenting with no risk factors but with morbid ruminations; may be classified as minimal risk based on the severity of the depressive symptoms  Discharge Diagnoses:   AXIS I:  Schizoaffective Disorder AXIS II:  Deferred AXIS III:   Past Medical History  Diagnosis Date  . Paranoid schizophrenia    AXIS IV:  economic problems, housing problems, other psychosocial or environmental problems, problems related to social environment and problems with primary support group AXIS V:  61-70 mild symptoms  Plan Of Care/Follow-up recommendations:  Activity:  As tolerated Diet:  Regular  Is patient on multiple antipsychotic therapies at discharge:  No   Has Patient had three or more failed trials of antipsychotic monotherapy by history:  No  Recommended Plan for Multiple Antipsychotic Therapies: NA    Kayman Snuffer,JANARDHAHA R. 06/25/2013, 1:43 PM

## 2013-06-25 NOTE — Progress Notes (Signed)
D:  Patient's self inventory sheet, patient sleeps very well, poor appetite, low energy level, poor attention span.  Rated depression, hopeless and anxiety 10.  Denied withdrawals.  Denied SI.  [facility].  Stated he does hear voices whispering.  A:  Medications administered per MD orders.  Emotional support and encouragement given patient. R:  Denied SI and HI.  Contracts for safety.  Denied visual hallucinations.  Does hear voices whispering to him, not sure what voices are saying to him.  Will continue to monitor patient for safety with 15 minute checks.  Safety maintained. Patient went to first morning group and then went to bed.

## 2013-06-25 NOTE — Progress Notes (Signed)
Recreation Therapy Notes  Date: 02.19.2015 Time: 2:45pm Location: [address] Day[address]   Group Topic: Communication, Team Building, Problem Solving  Goal Area(s) Addresses:  Patient will effectively work with peer towards shared goal.  Patient will identify skill used to make activity successful.  Patient will identify how skills used during activity can be used to reach post d/c goals.   Behavioral Response: Did not attend.   Marykay Lexenise L Tempest Frankland, LRT/CTRS  Teara Duerksen L 06/25/2013 9:02 PM

## 2013-06-25 NOTE — Progress Notes (Signed)
Discharge Note:  Patient denied SI and HI.  Denied A/V hallucinations.   Denied pain.  Patient [facility] ticket and [facility].  Patient stated he received all his belongings, clothing, shoes, coat, miscellaneous items, toiletries, medications, prescriptions, wallet, money, cards.  Suicide prevention information given and discussed with patient who stated he understood and had no questions.  Patient stated he appreciated all assistance received from [facility].

## 2013-06-29 ENCOUNTER — Emergency Department (HOSPITAL_COMMUNITY)
Admission: EM | Admit: 2013-06-29 | Discharge: 2013-06-29 | Disposition: A | Discharge status: Home / Self Care | Payer: Self-pay | Attending: Emergency Medicine | Admitting: Emergency Medicine

## 2013-06-29 ENCOUNTER — Encounter (HOSPITAL_COMMUNITY): Payer: Self-pay | Admitting: Emergency Medicine

## 2013-06-29 DIAGNOSIS — B852 Pediculosis, unspecified: Secondary | ICD-10-CM

## 2013-06-29 DIAGNOSIS — F200 Paranoid schizophrenia: Secondary | ICD-10-CM | POA: Insufficient documentation

## 2013-06-29 DIAGNOSIS — Z79899 Other long term (current) drug therapy: Secondary | ICD-10-CM | POA: Insufficient documentation

## 2013-06-29 DIAGNOSIS — Z5900 Homelessness unspecified: Secondary | ICD-10-CM | POA: Insufficient documentation

## 2013-06-29 DIAGNOSIS — L02818 Cutaneous abscess of other sites: Secondary | ICD-10-CM | POA: Insufficient documentation

## 2013-06-29 DIAGNOSIS — L03818 Cellulitis of other sites: Principal | ICD-10-CM

## 2013-06-29 DIAGNOSIS — B850 Pediculosis due to Pediculus humanus capitis: Secondary | ICD-10-CM | POA: Insufficient documentation

## 2013-06-29 DIAGNOSIS — F17200 Nicotine dependence, unspecified, uncomplicated: Secondary | ICD-10-CM | POA: Insufficient documentation

## 2013-06-29 DIAGNOSIS — L0291 Cutaneous abscess, unspecified: Secondary | ICD-10-CM

## 2013-06-29 MED ORDER — SULFAMETHOXAZOLE-TRIMETHOPRIM 800-160 MG PO TABS: 1.0000 | ORAL_TABLET | Freq: Two times a day (BID) | ORAL | Status: DC

## 2013-06-29 MED ORDER — PERMETHRIN 5 % EX CREA: TOPICAL_CREAM | CUTANEOUS | Status: DC

## 2013-06-29 MED ORDER — SULFAMETHOXAZOLE-TMP DS 800-160 MG PO TABS
1.0000 | ORAL_TABLET | Freq: Once | ORAL | Status: AC
  Administered 2013-06-29: 1 via ORAL
  Filled 2013-06-29: qty 1

## 2013-06-29 NOTE — ED Provider Notes (Signed)
Medical screening examination/treatment/procedure(s) were performed by non-physician practitioner and as supervising physician I was immediately available for consultation/collaboration.  EKG Interpretation   None         Clevie Prout L Anabela Crayton, MD 06/29/13 1642 

## 2013-06-29 NOTE — Discharge Instructions (Signed)
Abscess An abscess is an infected area that contains a collection of pus and debris.It can occur in almost any part of the body. An abscess is also known as a furuncle or boil. CAUSES  An abscess occurs when tissue gets infected. This can occur from blockage of oil or sweat glands, infection of hair follicles, or a minor injury to the skin. As the body tries to fight the infection, pus collects in the area and creates pressure under the skin. This pressure causes pain. People with weakened immune systems have difficulty fighting infections and get certain abscesses more often.  SYMPTOMS Usually an abscess develops on the skin and becomes a painful mass that is red, warm, and tender. If the abscess forms under the skin, you may feel a moveable soft area under the skin. Some abscesses break open (rupture) on their own, but most will continue to get worse without care. The infection can spread deeper into the body and eventually into the bloodstream, causing you to feel ill.  DIAGNOSIS  Your caregiver will take your medical history and perform a physical exam. A sample of fluid may also be taken from the abscess to determine what is causing your infection. TREATMENT  Your caregiver may prescribe antibiotic medicines to fight the infection. However, taking antibiotics alone usually does not cure an abscess. Your caregiver may need to make a small cut (incision) in the abscess to drain the pus. In some cases, gauze is packed into the abscess to reduce pain and to continue draining the area. HOME CARE INSTRUCTIONS   Only take over-the-counter or prescription medicines for pain, discomfort, or fever as directed by your caregiver.  If you were prescribed antibiotics, take them as directed. Finish them even if you start to feel better.  If gauze is used, follow your caregiver's directions for changing the gauze.  To avoid spreading the infection:  Keep your draining abscess covered with a  bandage.  Wash your hands well.  Do not share personal care items, towels, or whirlpools with others.  Avoid skin contact with others.  Keep your skin and clothes clean around the abscess.  Keep all follow-up appointments as directed by your caregiver. SEEK MEDICAL CARE IF:   You have increased pain, swelling, redness, fluid drainage, or bleeding.  You have muscle aches, chills, or a general ill feeling.  You have a fever. MAKE SURE YOU:   Understand these instructions.  Will watch your condition.  Will get help right away if you are not doing well or get worse. Document Released: 01/31/2005 Document Revised: 10/23/2011 Document Reviewed: 07/06/2011 Exit[employer] Patient Information 2014 [employer].  

## 2013-06-29 NOTE — ED Notes (Signed)
Pt appears to have a significant infestation of head and body lice.

## 2013-06-29 NOTE — ED Provider Notes (Signed)
CSN: [id number]     Arrival date & time 06/29/13  1218 History  This chart was scribed for non-physician practitioner, Roxy Horseman, PA-C working with Benny Lennert, MD by Greggory Stallion, ED scribe. This patient was seen in room TR09C/TR09C and the patient's care was started at 1:26 PM.     Chief Complaint  Patient presents with  . Abscess   The history is provided by the patient. No language interpreter was used.   HPI Comments: [name] [name] is a 53 y.o. male who presents to the Emergency Department complaining of two worsening abscesses to the back of his head that started a few days ago. He states there has been some drainage from the areas. Pt currently lives at a homeless shelter. He also currently has lice. No fevers, or vomiting.  Past Medical History  Diagnosis Date  . Paranoid schizophrenia    History reviewed. No pertinent past surgical history. Family History  Problem Relation Age of Onset  . Diabetes Mother   . Hypertension Mother   . Cancer Father    History  Substance Use Topics  . Smoking status: Current Every Day Smoker -- 1.50 packs/day for 40 years    Types: Cigarettes  . Smokeless tobacco: Never Used  . Alcohol Use: No    Review of Systems  Constitutional: Negative for fever.  HENT: Negative for congestion.   Eyes: Negative for redness.  Respiratory: Negative for shortness of breath.   Musculoskeletal: Negative for gait problem.  Skin: Positive for wound.  Neurological: Negative for speech difficulty.  Psychiatric/Behavioral: Negative for confusion.   Allergies  Review of patient's allergies indicates no known allergies.  Home Medications   Current Outpatient Rx  Name  Route  Sig  Dispense  Refill  . benztropine (COGENTIN) 1 MG tablet   Oral   Take 1 tablet (1 mg total) by mouth 2 (two) times daily.   60 tablet   0   . citalopram (CELEXA) 20 MG tablet   Oral   Take 1 tablet (20 mg total) by mouth daily.   30 tablet   0   .  divalproex (DEPAKOTE ER) 250 MG 24 hr tablet   Oral   Take 3 tablets (750 mg total) by mouth 2 (two) times daily after a meal.   180 tablet   0   . risperiDONE (RISPERDAL) 0.5 MG tablet   Oral   Take 5 tablets (2.5 mg total) by mouth 2 (two) times daily.   300 tablet   0   . traZODone (DESYREL) 50 MG tablet   Oral   Take 1 tablet (50 mg total) by mouth at bedtime as needed for sleep.   30 tablet   0    BP 120/68  Pulse 84  Temp(Src) 98 F (36.7 C) (Oral)  Resp 16  Ht 5\' 7"  (1.702 m)  Wt 161 lb (73.029 kg)  BMI 25.21 kg/m2  SpO2 99%  Physical Exam  Nursing note and vitals reviewed. Constitutional: He is oriented to person, place, and time. He appears well-developed. No distress.  HENT:  Head: Normocephalic and atraumatic.  Scalp remarkable for diffuse dandruff, lice, and one abscess approximately 1 x 1 cm with purulent drainage.   Eyes: Conjunctivae and EOM are normal.  Cardiovascular: Normal rate and regular rhythm.   Pulmonary/Chest: Effort normal. No stridor. No respiratory distress.  Abdominal: He exhibits no distension.  Musculoskeletal: He exhibits no edema.  Neurological: He is alert and oriented to person, place,  and time.  Skin: Skin is warm and dry.  Psychiatric: He has a normal mood and affect.    ED Course  Procedures (including critical care time)  DIAGNOSTIC STUDIES: Oxygen Saturation is 99% on RA, normal by my interpretation.    COORDINATION OF CARE: 1:27 PM-Discussed treatment plan which includes I&D of one abscess and an antibiotic with pt at bedside and pt agreed to plan.   Labs Review Labs Reviewed - No data to display Imaging Review No results found.  EKG Interpretation   None       MDM   Final diagnoses:  Abscess  Lice    Patient with skin abscess amenable to incision and drainage.  Abscess was not large enough to warrant packing or drain,  wound recheck in 2 days. Encouraged home warm soaks and flushing.  Mild signs of  cellulitis is surrounding skin.  Will d/c to home.   Will also treat lice.   I personally performed the services described in this documentation, which was scribed in my presence. The recorded information has been reviewed and is accurate.    Roxy Horsemanobert Janis Sol, PA-C 06/29/13 [phone]

## 2013-06-29 NOTE — ED Notes (Signed)
Pt reports that he thinks that he has 2 cyst on the back of his head. Reports he has been treated here for lice in the past.

## 2013-06-30 NOTE — Progress Notes (Signed)
Patient Discharge Instructions:  After Visit Summary (AVS):   Faxed to:  06/30/13 Discharge Summary Note:   Faxed to:  06/30/13 Psychiatric Admission Assessment Note:   Faxed to:  06/30/13 Suicide Risk Assessment - Discharge Assessment:   Faxed to:  06/30/13 Faxed/Sent to the Next Level Care provider:  06/30/13 [facility] to [facility] @ [phone]  Jerelene ReddenSheena E [place], 06/30/2013, 3:15 PM

## 2013-08-13 ENCOUNTER — Emergency Department (HOSPITAL_COMMUNITY)
Admission: EM | Admit: 2013-08-13 | Discharge: 2013-08-13 | Disposition: A | Discharge status: Home / Self Care | Payer: Self-pay | Attending: Emergency Medicine | Admitting: Emergency Medicine

## 2013-08-13 ENCOUNTER — Encounter (HOSPITAL_COMMUNITY): Payer: Self-pay | Admitting: Emergency Medicine

## 2013-08-13 DIAGNOSIS — L03221 Cellulitis of neck: Principal | ICD-10-CM

## 2013-08-13 DIAGNOSIS — F200 Paranoid schizophrenia: Secondary | ICD-10-CM | POA: Insufficient documentation

## 2013-08-13 DIAGNOSIS — Z79899 Other long term (current) drug therapy: Secondary | ICD-10-CM | POA: Insufficient documentation

## 2013-08-13 DIAGNOSIS — IMO0002 Reserved for concepts with insufficient information to code with codable children: Secondary | ICD-10-CM | POA: Insufficient documentation

## 2013-08-13 DIAGNOSIS — L0291 Cutaneous abscess, unspecified: Secondary | ICD-10-CM

## 2013-08-13 DIAGNOSIS — L0211 Cutaneous abscess of neck: Secondary | ICD-10-CM | POA: Insufficient documentation

## 2013-08-13 DIAGNOSIS — F17200 Nicotine dependence, unspecified, uncomplicated: Secondary | ICD-10-CM | POA: Insufficient documentation

## 2013-08-13 MED ORDER — CEPHALEXIN 500 MG PO CAPS: 500.0000 mg | ORAL_CAPSULE | Freq: Four times a day (QID) | ORAL | Status: DC

## 2013-08-13 NOTE — Discharge Instructions (Signed)
Abscess An abscess is an infected area that contains a collection of pus and debris.It can occur in almost any part of the body. An abscess is also known as a furuncle or boil. CAUSES  An abscess occurs when tissue gets infected. This can occur from blockage of oil or sweat glands, infection of hair follicles, or a minor injury to the skin. As the body tries to fight the infection, pus collects in the area and creates pressure under the skin. This pressure causes pain. People with weakened immune systems have difficulty fighting infections and get certain abscesses more often.  SYMPTOMS Usually an abscess develops on the skin and becomes a painful mass that is red, warm, and tender. If the abscess forms under the skin, you may feel a moveable soft area under the skin. Some abscesses break open (rupture) on their own, but most will continue to get worse without care. The infection can spread deeper into the body and eventually into the bloodstream, causing you to feel ill.  DIAGNOSIS  Your caregiver will take your medical history and perform a physical exam. A sample of fluid may also be taken from the abscess to determine what is causing your infection. TREATMENT  Your caregiver may prescribe antibiotic medicines to fight the infection. However, taking antibiotics alone usually does not cure an abscess. Your caregiver may need to make a small cut (incision) in the abscess to drain the pus. In some cases, gauze is packed into the abscess to reduce pain and to continue draining the area. HOME CARE INSTRUCTIONS   Only take over-the-counter or prescription medicines for pain, discomfort, or fever as directed by your caregiver.  If you were prescribed antibiotics, take them as directed. Finish them even if you start to feel better.  If gauze is used, follow your caregiver's directions for changing the gauze.  To avoid spreading the infection:  Keep your draining abscess covered with a  bandage.  Wash your hands well.  Do not share personal care items, towels, or whirlpools with others.  Avoid skin contact with others.  Keep your skin and clothes clean around the abscess.  Keep all follow-up appointments as directed by your caregiver. SEEK MEDICAL CARE IF:   You have increased pain, swelling, redness, fluid drainage, or bleeding.  You have muscle aches, chills, or a general ill feeling.  You have a fever. MAKE SURE YOU:   Understand these instructions.  Will watch your condition.  Will get help right away if you are not doing well or get worse. Document Released: 01/31/2005 Document Revised: 10/23/2011 Document Reviewed: 07/06/2011 Exit[employer] Patient Information 2014 [employer].  

## 2013-08-13 NOTE — ED Provider Notes (Signed)
Medical screening examination/treatment/procedure(s) were performed by non-physician practitioner and as supervising physician I was immediately available for consultation/collaboration.   EKG Interpretation None        Nafisah Runions, MD 08/13/13 2351 

## 2013-08-13 NOTE — ED Notes (Signed)
Pt states rash on his neck on a boil to the left side of his neck.

## 2013-08-13 NOTE — ED Provider Notes (Signed)
CSN: [id number]     Arrival date & time 08/13/13  2053 History  This chart was scribed for non-physician practitioner Roxy Horseman, PA-C working with Glynn Octave, MD by Danella Maiers, ED Scribe. This patient was seen in room TR07C/TR07C and the patient's care was started at 9:21 PM.    Chief Complaint  Patient presents with  . Rash   The history is provided by the patient. No language interpreter was used.   HPI Comments: [name] [name] is a 53 y.o. male who presents to the Emergency Department with the chief complaint of a gradual-onset, gradually-worsening abscess to his posterior neck with associated neck soreness. Pt has a h/o abscesses.  No fevers, chills, nausea, or vomiting.  Nothing tried to alleviate the symptoms.  No associated aggravating or alleviating factors.   Past Medical History  Diagnosis Date  . Paranoid schizophrenia    History reviewed. No pertinent past surgical history. Family History  Problem Relation Age of Onset  . Diabetes Mother   . Hypertension Mother   . Cancer Father    History  Substance Use Topics  . Smoking status: Current Every Day Smoker -- 1.50 packs/day for 40 years    Types: Cigarettes  . Smokeless tobacco: Never Used  . Alcohol Use: No    Review of Systems  Constitutional: Negative for fever.  Cardiovascular: Negative for chest pain.  Gastrointestinal: Negative for vomiting.  Skin: Negative for rash.      Allergies  Review of patient's allergies indicates no known allergies.  Home Medications   Current Outpatient Rx  Name  Route  Sig  Dispense  Refill  . benztropine (COGENTIN) 1 MG tablet   Oral   Take 1 tablet (1 mg total) by mouth 2 (two) times daily.   60 tablet   0   . citalopram (CELEXA) 20 MG tablet   Oral   Take 1 tablet (20 mg total) by mouth daily.   30 tablet   0   . divalproex (DEPAKOTE ER) 250 MG 24 hr tablet   Oral   Take 3 tablets (750 mg total) by mouth 2 (two) times daily after a meal.   180  tablet   0   . permethrin (ELIMITE) 5 % cream      Apply to entire body other than face - let sit for 14 hours then wash off, may repeat in 1 week if still having symptoms   60 g   1   . risperiDONE (RISPERDAL) 0.5 MG tablet   Oral   Take 5 tablets (2.5 mg total) by mouth 2 (two) times daily.   300 tablet   0   . sulfamethoxazole-trimethoprim (SEPTRA DS) 800-160 MG per tablet   Oral   Take 1 tablet by mouth every 12 (twelve) hours.   20 tablet   0   . traZODone (DESYREL) 50 MG tablet   Oral   Take 1 tablet (50 mg total) by mouth at bedtime as needed for sleep.   30 tablet   0    BP 130/76  Pulse 88  Temp(Src) 97.9 F (36.6 C) (Oral)  Resp 20  Wt 168 lb 5 oz (76.346 kg)  SpO2 95% Physical Exam  Nursing note and vitals reviewed. Constitutional: He is oriented to person, place, and time. He appears well-developed and well-nourished. No distress.  HENT:  Head: Normocephalic and atraumatic.  Eyes: EOM are normal.  Neck: Neck supple. No tracheal deviation present.  Cardiovascular: Normal rate.  Pulmonary/Chest: Effort normal. No respiratory distress.  Musculoskeletal: Normal range of motion.  Neurological: He is alert and oriented to person, place, and time.  Skin: Skin is warm and dry.  Left sided sub-occipital region remarkable for a 1 cm abscess, no discharge or drainage.   Psychiatric: He has a normal mood and affect. His behavior is normal.    ED Course  Procedures (including critical care time) Medications - No data to display  DIAGNOSTIC STUDIES: Oxygen Saturation is 95% on RA, normal by my interpretation.    COORDINATION OF CARE: 10:24 PM- Discussed treatment plan with pt. Pt agrees to plan.  INCISION AND DRAINAGE Performed by: Roxy Horsemanobert Tanairi Cypert Consent: Verbal consent obtained. Risks and benefits: risks, benefits and alternatives were discussed Type: abscess  Body area: left sub occiput  Anesthesia: local infiltration  Incision was made with 18  gauge needle.  Local anesthetic: lidocaine 2% with epinephrine  Anesthetic total: 1 ml  Complexity: complex Blunt dissection to break up loculations  Drainage: purulent  Drainage amount: mild  Patient tolerance: Patient tolerated the procedure well with no immediate complications.     Labs Review Labs Reviewed - No data to display Imaging Review No results found.   EKG Interpretation None      MDM   Final diagnoses:  Abscess    Patient with abscess to the suboccipital, possibly a lymph node, I was able to get a small amount of purulent discharge. I've advised patient that he use Keflex, and warm compresses, and return if it starts to ooze or drain.  I personally performed the services described in this documentation, which was scribed in my presence. The recorded information has been reviewed and is accurate.    Roxy Horsemanobert Krystin Keeven, PA-C 08/13/13 2248

## 2013-08-14 ENCOUNTER — Encounter (HOSPITAL_COMMUNITY): Payer: Self-pay | Admitting: Emergency Medicine

## 2013-08-14 ENCOUNTER — Emergency Department (HOSPITAL_COMMUNITY)
Admission: EM | Admit: 2013-08-14 | Discharge: 2013-08-17 | Disposition: A | Discharge status: Other Acute Inpatient Hospital | Payer: Self-pay | Attending: Emergency Medicine | Admitting: Emergency Medicine

## 2013-08-14 DIAGNOSIS — F329 Major depressive disorder, single episode, unspecified: Secondary | ICD-10-CM

## 2013-08-14 DIAGNOSIS — Z79899 Other long term (current) drug therapy: Secondary | ICD-10-CM | POA: Insufficient documentation

## 2013-08-14 DIAGNOSIS — F32A Depression, unspecified: Secondary | ICD-10-CM

## 2013-08-14 DIAGNOSIS — Z792 Long term (current) use of antibiotics: Secondary | ICD-10-CM | POA: Insufficient documentation

## 2013-08-14 DIAGNOSIS — F3289 Other specified depressive episodes: Secondary | ICD-10-CM | POA: Insufficient documentation

## 2013-08-14 DIAGNOSIS — F200 Paranoid schizophrenia: Secondary | ICD-10-CM | POA: Insufficient documentation

## 2013-08-14 DIAGNOSIS — R45851 Suicidal ideations: Secondary | ICD-10-CM | POA: Insufficient documentation

## 2013-08-14 DIAGNOSIS — F17200 Nicotine dependence, unspecified, uncomplicated: Secondary | ICD-10-CM | POA: Insufficient documentation

## 2013-08-14 DIAGNOSIS — Z5900 Homelessness unspecified: Secondary | ICD-10-CM | POA: Insufficient documentation

## 2013-08-14 DIAGNOSIS — F430 Acute stress reaction: Secondary | ICD-10-CM | POA: Insufficient documentation

## 2013-08-14 LAB — CBC
HCT: 41.5 % (ref 39.0–52.0)
Hemoglobin: 14.8 g/dL (ref 13.0–17.0)
MCH: 32.8 pg (ref 26.0–34.0)
MCHC: 35.7 g/dL (ref 30.0–36.0)
MCV: 92 fL (ref 78.0–100.0)
PLATELETS: 326 10*3/uL (ref 150–400)
RBC: 4.51 MIL/uL (ref 4.22–5.81)
RDW: 14 % (ref 11.5–15.5)
WBC: 9.2 10*3/uL (ref 4.0–10.5)

## 2013-08-14 LAB — COMPREHENSIVE METABOLIC PANEL
ALBUMIN: 3.7 g/dL (ref 3.5–5.2)
ALT: 22 U/L (ref 0–53)
AST: 20 U/L (ref 0–37)
Alkaline Phosphatase: 103 U/L (ref 39–117)
BUN: 8 mg/dL (ref 6–23)
CHLORIDE: 106 meq/L (ref 96–112)
CO2: 23 mEq/L (ref 19–32)
CREATININE: 0.92 mg/dL (ref 0.50–1.35)
Calcium: 9.7 mg/dL (ref 8.4–10.5)
GFR calc Af Amer: >90 mL/min (ref >90)
GFR calc non Af Amer: >90 mL/min (ref >90)
Glucose, Bld: 102 mg/dL — ABNORMAL HIGH (ref 70–99)
Potassium: 4.3 mEq/L (ref 3.7–5.3)
Sodium: 145 mEq/L (ref 137–147)
Total Protein: 7.4 g/dL (ref 6.0–8.3)

## 2013-08-14 LAB — ETHANOL

## 2013-08-14 LAB — RAPID URINE DRUG SCREEN, HOSP PERFORMED
AMPHETAMINES: NOT DETECTED
Barbiturates: NOT DETECTED
Benzodiazepines: NOT DETECTED
COCAINE: NOT DETECTED
Opiates: NOT DETECTED
Tetrahydrocannabinol: POSITIVE — AB

## 2013-08-14 LAB — ACETAMINOPHEN LEVEL: Acetaminophen (Tylenol), Serum: <15 ug/mL (ref 10–30)

## 2013-08-14 LAB — SALICYLATE LEVEL

## 2013-08-14 NOTE — ED Notes (Signed)
Patient reports he has a lot of stress from being homeless and not working in the last 4 years.

## 2013-08-14 NOTE — ED Notes (Signed)
The pt  Is c/o beinf depressed for the past 3-4 days

## 2013-08-14 NOTE — ED Provider Notes (Signed)
CSN: [id number]     Arrival date & time 08/14/13  2146 History   First MD Initiated Contact with Patient 08/14/13 2355     Chief Complaint  Patient presents with  . Depression   HPI  History provided by the patient. Patient is a 53 year old male with past history of paranoid schizophrenia presenting with increased depression and suicidal ideation. Patient states symptoms have been worsening over the past few days. States they are related to the stresses of being homeless. He was recently staying at [facility] but he was kicked out due to his length of stay and that his "time was up". Patient denies having any plan of SI. Denies any other complaints. No recent illnesses. Patient has not been on any of his medications for the past one to 2 weeks. Does report some occasional marijuana use. Also smokes tobacco. Denies other drug or alcohol use.    Past Medical History  Diagnosis Date  . Paranoid schizophrenia    History reviewed. No pertinent past surgical history. Family History  Problem Relation Age of Onset  . Diabetes Mother   . Hypertension Mother   . Cancer Father    History  Substance Use Topics  . Smoking status: Current Every Day Smoker -- 1.50 packs/day for 40 years    Types: Cigarettes  . Smokeless tobacco: Never Used  . Alcohol Use: No    Review of Systems  All other systems reviewed and are negative.     Allergies  Review of patient's allergies indicates no known allergies.  Home Medications   Current Outpatient Rx  Name  Route  Sig  Dispense  Refill  . ARIPiprazole (ABILIFY) 20 MG tablet   Oral   Take 20 mg by mouth daily.         . cephALEXin (KEFLEX) 500 MG capsule   Oral   Take 1 capsule (500 mg total) by mouth 4 (four) times daily.   40 capsule   0   . citalopram (CELEXA) 20 MG tablet   Oral   Take 1 tablet (20 mg total) by mouth daily.   30 tablet   0   . hydrOXYzine (ATARAX/VISTARIL) 25 MG tablet   Oral   Take 25 mg by mouth 3  (three) times daily as needed for anxiety.          BP 119/70  Pulse 82  Temp(Src) 98.1 F (36.7 C) (Oral)  Resp 18  Ht 5\' 8"  (1.727 m)  Wt 168 lb (76.204 kg)  BMI 25.55 kg/m2  SpO2 95% Physical Exam  Nursing note and vitals reviewed. Constitutional: He is oriented to person, place, and time. He appears well-developed and well-nourished. No distress.  HENT:  Head: Normocephalic.  Cardiovascular: Normal rate and regular rhythm.   Pulmonary/Chest: Effort normal and breath sounds normal. No respiratory distress. He has no wheezes. He has no rales.  Abdominal: Soft.  Musculoskeletal: Normal range of motion.  Neurological: He is alert and oriented to person, place, and time.  Skin: Skin is warm.  Psychiatric: His behavior is normal. He exhibits a depressed mood. He expresses suicidal ideation. He expresses no homicidal ideation. He expresses no suicidal plans.    ED Course  Procedures   COORDINATION OF CARE:  Nursing notes reviewed. Vital signs reviewed. Initial pt interview and examination performed.   Filed Vitals:   08/14/13 2152  BP: 119/70  Pulse: 82  Temp: 98.1 F (36.7 C)  TempSrc: Oral  Resp: 18  Height: 5\' 8"  (  1.727 m)  Weight: 168 lb (76.204 kg)  SpO2: 95%    11:58 PM- patient seen and evaluated. Patient appears calm no acute distress. Does express some depression and suicidal ideations. No plan. [facility]  Psychiatric holding orders in place. TTS consult placed. Patient is medically cleared and able to continue with psychiatric evaluation.   Results for orders placed during the hospital encounter of 08/14/13  ACETAMINOPHEN LEVEL      Result Value Ref Range   Acetaminophen (Tylenol), Serum <15.0  10 - 30 ug/mL  CBC      Result Value Ref Range   WBC 9.2  4.0 - 10.5 K/uL   RBC 4.51  4.22 - 5.81 MIL/uL   Hemoglobin 14.8  13.0 - 17.0 g/dL   HCT 16.141.5  09.639.0 - 04.552.0 %   MCV 92.0  78.0 -  100.0 fL   MCH 32.8  26.0 - 34.0 pg   MCHC 35.7  30.0 - 36.0 g/dL   RDW 40.914.0  81.111.5 - 91.415.5 %   Platelets 326  150 - 400 K/uL  COMPREHENSIVE METABOLIC PANEL      Result Value Ref Range   Sodium 145  137 - 147 mEq/L   Potassium 4.3  3.7 - 5.3 mEq/L   Chloride 106  96 - 112 mEq/L   CO2 23  19 - 32 mEq/L   Glucose, Bld 102 (*) 70 - 99 mg/dL   BUN 8  6 - 23 mg/dL   Creatinine, Ser 7.820.92  0.50 - 1.35 mg/dL   Calcium 9.7  8.4 - 95.610.5 mg/dL   Total Protein 7.4  6.0 - 8.3 g/dL   Albumin 3.7  3.5 - 5.2 g/dL   AST 20  0 - 37 U/L   ALT 22  0 - 53 U/L   Alkaline Phosphatase 103  39 - 117 U/L   Total Bilirubin <0.2 (*) 0.3 - 1.2 mg/dL   GFR calc non Af Amer >90  >90 mL/min   GFR calc Af Amer >90  >90 mL/min  ETHANOL      Result Value Ref Range   Alcohol, Ethyl (B) <11  0 - 11 mg/dL  SALICYLATE LEVEL      Result Value Ref Range   Salicylate Lvl <2.0 (*) 2.8 - 20.0 mg/dL  URINE RAPID DRUG SCREEN (HOSP PERFORMED)      Result Value Ref Range   Opiates NONE DETECTED  NONE DETECTED   Cocaine NONE DETECTED  NONE DETECTED   Benzodiazepines NONE DETECTED  NONE DETECTED   Amphetamines NONE DETECTED  NONE DETECTED   Tetrahydrocannabinol POSITIVE (*) NONE DETECTED   Barbiturates NONE DETECTED  NONE DETECTED      MDM   Final diagnoses:  Depression  Suicidal ideation        Angus Sellereter S Amandalee Lacap, PA-C 08/15/13 0006

## 2013-08-15 ENCOUNTER — Encounter (HOSPITAL_COMMUNITY): Payer: Self-pay | Admitting: Emergency Medicine

## 2013-08-15 MED ORDER — ZOLPIDEM TARTRATE 5 MG PO TABS
5.0000 mg | ORAL_TABLET | Freq: Every evening | ORAL | Status: DC | PRN
Start: 2013-08-15 — End: 2013-08-17

## 2013-08-15 MED ORDER — NICOTINE 21 MG/24HR TD PT24
21.0000 mg | MEDICATED_PATCH | Freq: Every day | TRANSDERMAL | Status: DC
  Administered 2013-08-15 – 2013-08-17 (×3): 21 mg via TRANSDERMAL
  Filled 2013-08-15: qty 14
  Filled 2013-08-15 (×2): qty 1

## 2013-08-15 MED ORDER — HYDROXYZINE HCL 25 MG PO TABS
25.0000 mg | ORAL_TABLET | Freq: Three times a day (TID) | ORAL | Status: DC | PRN
  Administered 2013-08-15: 25 mg via ORAL
  Filled 2013-08-15 (×2): qty 1

## 2013-08-15 MED ORDER — ONDANSETRON HCL 4 MG PO TABS: 4.0000 mg | ORAL_TABLET | Freq: Three times a day (TID) | ORAL | Status: DC | PRN

## 2013-08-15 MED ORDER — CITALOPRAM HYDROBROMIDE 10 MG PO TABS
20.0000 mg | ORAL_TABLET | Freq: Every day | ORAL | Status: DC
  Administered 2013-08-15 – 2013-08-17 (×3): 20 mg via ORAL
  Filled 2013-08-15 (×3): qty 2

## 2013-08-15 MED ORDER — ARIPIPRAZOLE 10 MG PO TABS
20.0000 mg | ORAL_TABLET | Freq: Every day | ORAL | Status: DC
  Administered 2013-08-15 – 2013-08-17 (×3): 20 mg via ORAL
  Filled 2013-08-15 (×3): qty 2
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
## 2013-08-15 NOTE — BH Assessment (Signed)
Clinician contacted [name] [name], [name] [name] working with pt to gather report prior to assessment. [name] [name] indicated pt was reporting SI with no plan and he has been on the street for 6 days after time was up at the homeless shelter.  Clinician contacted [name] BraunKaren, [name] [name] and requested tele-assessment machine be put in pt's room. Tele-assessment to be initiated.  [name] [name], MSW, LCSW Triage Specialist [phone]

## 2013-08-15 NOTE — Progress Notes (Signed)
The following facilities have been contacted regarding bed availability:  [facility], referral faxed [facility]- per [facility] beds available, referral faxed [facility]- per Thea SilversmithMacKenzie can fax, rferral faxed [facility]- per Alinda Moneyony beds available, referral faxed [facility]- low acuity adult beds available, [facility]- per [place] can fax, referral faxed   [facility]- [facility]- per Dorathy DaftKayla only 1 male low acuity bed available [facility]- per [facility] beds available but pt must be involuntary [facility]- per [facility]ollie Scalelivia at [facility]- per Olegario MessierKathy at [facility]- per MershonAnissa at [facility]- per [facility] only 2 child beds [facility]- per [facility] beds only [facility]- per Tacey RuizLeah at [facility]- per Christiane HaJonathan only male beds available   Tomi BambergerMariya Mavery Milling Disposition [facility]

## 2013-08-15 NOTE — Progress Notes (Signed)
Case Manager met patient at bedside.Role of CM explained.Patient reports this ED visit is secondary to suicidal ideation which has Increased over the past few days.Patient reports He does not have a PCP.Education provided on the importance of establishing PCP services.Resource sheet provided for the [facility].Patient reports he is homeless and  Needs to stay at another shelter as he's exhausted his days at [facility].CM will liaise with ED social worker regarding patients plan of care.

## 2013-08-15 NOTE — Progress Notes (Signed)
Case Manager spoke with ED Social worker regarding resources for patient.Patient provided with resources for [facility] ( [place] [facility]) Affordable housing list, [facility]  [place] shelter list,[address],[facility] services list.Teach back method used to confirm patients understanding.No further CM needs identified.

## 2013-08-15 NOTE — BH Assessment (Signed)
Clinician consulted with Alberteen SamFran Hobson NP who states Pt meets criteria for inpatient admission. Joann, AC [facility]. TTS will seek placement at other facilities. Clinician provided update to [place], [place]. Yaakov Guthrieelilah Stewart, MSW, LCSW Triage Specialist [phone]

## 2013-08-16 MED ORDER — IBUPROFEN 400 MG PO TABS: 600.0000 mg | ORAL_TABLET | Freq: Three times a day (TID) | ORAL | Status: DC | PRN

## 2013-08-16 MED ORDER — ACETAMINOPHEN 325 MG PO TABS
650.0000 mg | ORAL_TABLET | ORAL | Status: DC | PRN
  Administered 2013-08-16 (×2): 650 mg via ORAL
  Filled 2013-08-16 (×2): qty 2

## 2013-08-16 NOTE — BH Assessment (Signed)
[facility] Assessment Progress Note  Update:  Pt seen for reassessment this day.  Pt continues to endorse SI and is unable to contract for safety.  Pt denies HI.  Pt did state he "saw images of my mother this morning" (hallucinations) and that this made him sad.  He stated her birthday is approaching and he is still mourning the loss of her.  Pt presents as pleasant, calm, cooperative, with depressed mood and blunted affect.  Pt is on the wait list for OV, as [facility].  TTS will continue to seek placement for the pt.  Updated ED and TTS staff.  Casimer LaniusKristen Asir Bingley, MS, [facility] Licensed Professional Counselor Triage Specialist

## 2013-08-16 NOTE — Progress Notes (Signed)
[facility] contacted the following hospitals for psychiatric placement:  1)[facility]-faxed referral 2)[facility]-faxed for waitlist 3)[facility]-no outside referrals 08/15/13 4)[facility]-@ capacity 5)[facility]-no outside referrals 08/15/13 6)[facility]-faxed referral 7)[facility]-reviewing referral 8)[facility]-faxed referral 9)[facility]-no beds 10)[facility]-no male beds  Blain PaisMichelle L Gesselle Fitzsimons, [facility]HT/NS

## 2013-08-16 NOTE — Progress Notes (Signed)
Per Annice PihJackie, [facility] at [facility], pt added to waitlist.  Blain PaisMichelle L Kayli Beal, MHT/NS

## 2013-08-16 NOTE — Progress Notes (Signed)
[facility] d[facility] not having the proper bed available right now per VirdenKristen.   Tomi BambergerMariya Rance Smithson Disposition [facility]

## 2013-08-16 NOTE — ED Provider Notes (Signed)
Medical screening examination/treatment/procedure(s) were performed by non-physician practitioner and as supervising physician I was immediately available for consultation/collaboration.     Brandt LoosenJulie Leonides Minder, MD 08/16/13 [phone]

## 2013-08-17 ENCOUNTER — Observation Stay (HOSPITAL_COMMUNITY)
Admission: AD | Admit: 2013-08-17 | Discharge: 2013-08-18 | Disposition: A | Discharge status: Home / Self Care | Payer: Self-pay | Source: Intra-hospital | Attending: Family | Admitting: Family

## 2013-08-17 ENCOUNTER — Encounter (HOSPITAL_COMMUNITY): Payer: Self-pay | Admitting: Intensive Care

## 2013-08-17 DIAGNOSIS — Z5900 Homelessness unspecified: Secondary | ICD-10-CM | POA: Insufficient documentation

## 2013-08-17 DIAGNOSIS — F332 Major depressive disorder, recurrent severe without psychotic features: Secondary | ICD-10-CM

## 2013-08-17 DIAGNOSIS — F333 Major depressive disorder, recurrent, severe with psychotic symptoms: Secondary | ICD-10-CM | POA: Insufficient documentation

## 2013-08-17 DIAGNOSIS — F339 Major depressive disorder, recurrent, unspecified: Secondary | ICD-10-CM | POA: Diagnosis present

## 2013-08-17 DIAGNOSIS — F4310 Post-traumatic stress disorder, unspecified: Secondary | ICD-10-CM | POA: Insufficient documentation

## 2013-08-17 DIAGNOSIS — F209 Schizophrenia, unspecified: Secondary | ICD-10-CM

## 2013-08-17 DIAGNOSIS — F411 Generalized anxiety disorder: Secondary | ICD-10-CM | POA: Insufficient documentation

## 2013-08-17 DIAGNOSIS — F200 Paranoid schizophrenia: Principal | ICD-10-CM | POA: Insufficient documentation

## 2013-08-17 DIAGNOSIS — R45851 Suicidal ideations: Secondary | ICD-10-CM | POA: Insufficient documentation

## 2013-08-17 DIAGNOSIS — F1210 Cannabis abuse, uncomplicated: Secondary | ICD-10-CM | POA: Insufficient documentation

## 2013-08-17 HISTORY — DX: Schizophrenia, unspecified: F20.9

## 2013-08-17 MED ORDER — CITALOPRAM HYDROBROMIDE 10 MG PO TABS
10.0000 mg | ORAL_TABLET | Freq: Once | ORAL | Status: AC
  Administered 2013-08-17: 10 mg via ORAL

## 2013-08-17 MED ORDER — MAGNESIUM HYDROXIDE 400 MG/5ML PO SUSP: 30.0000 mL | Freq: Every day | ORAL | Status: DC | PRN

## 2013-08-17 MED ORDER — ACETAMINOPHEN 325 MG PO TABS
650.0000 mg | ORAL_TABLET | Freq: Four times a day (QID) | ORAL | Status: DC | PRN
Start: 2013-08-17 — End: 2013-08-18

## 2013-08-17 MED ORDER — ARIPIPRAZOLE 10 MG PO TABS
20.0000 mg | ORAL_TABLET | Freq: Every day | ORAL | Status: DC
  Administered 2013-08-18: 20 mg via ORAL
  Filled 2013-08-17 (×3): qty 2

## 2013-08-17 MED ORDER — CITALOPRAM HYDROBROMIDE 20 MG PO TABS
30.0000 mg | ORAL_TABLET | Freq: Every day | ORAL | Status: DC
  Administered 2013-08-18: 30 mg via ORAL
  Filled 2013-08-17 (×4): qty 1

## 2013-08-17 MED ORDER — PRAZOSIN HCL 2 MG PO CAPS
2.0000 mg | ORAL_CAPSULE | Freq: Every day | ORAL | Status: DC
  Filled 2013-08-17 (×2): qty 1

## 2013-08-17 MED ORDER — CEPHALEXIN 500 MG PO CAPS
500.0000 mg | ORAL_CAPSULE | Freq: Four times a day (QID) | ORAL | Status: DC
  Administered 2013-08-17 – 2013-08-18 (×5): 500 mg via ORAL
  Filled 2013-08-17: qty 2
  Filled 2013-08-17: qty 1
  Filled 2013-08-17: qty 2
  Filled 2013-08-17: qty 1
  Filled 2013-08-17: qty 2
  Filled 2013-08-17 (×6): qty 1
  Filled 2013-08-17 (×2): qty 2

## 2013-08-17 MED ORDER — CITALOPRAM HYDROBROMIDE 10 MG PO TABS
ORAL_TABLET | ORAL | Status: AC
  Filled 2013-08-17: qty 1

## 2013-08-17 MED ORDER — CITALOPRAM HYDROBROMIDE 20 MG PO TABS
20.0000 mg | ORAL_TABLET | Freq: Every day | ORAL | Status: DC
  Filled 2013-08-17 (×3): qty 1

## 2013-08-17 MED ORDER — ALUM & MAG HYDROXIDE-SIMETH 200-200-20 MG/5ML PO SUSP
30.0000 mL | ORAL | Status: DC | PRN
Start: 2013-08-17 — End: 2013-08-18

## 2013-08-17 MED ORDER — HYDROXYZINE HCL 25 MG PO TABS
25.0000 mg | ORAL_TABLET | Freq: Three times a day (TID) | ORAL | Status: DC | PRN
  Administered 2013-08-17: 25 mg via ORAL
  Filled 2013-08-17: qty 1

## 2013-08-17 NOTE — ED Provider Notes (Signed)
Pt with recent presentation to ED for treatment abscess, now improving.  Pt then noted homeless and recently kicked out of shelter, resulting in feelings of depression and ?SI.  Pt states still feels depressed. Denies suicidlal thoughts/plan or any recent attempt at self harm.   Pt acknowledges recent non compliance w meds.   Pt w normal mood/affect. Will get telepsych assessment this morning, re ?stable for d/c w outpt beh health follow up.       Suzi RootsKevin E Praise Stennett, MD 08/17/13 [id number]

## 2013-08-17 NOTE — ED Notes (Signed)
Pt. Getting a shower.

## 2013-08-17 NOTE — H&P (Signed)
[facility] OBS ADMISSION NOTE  08/17/2013 2:08 PM [name] [name]  MRN:  [id number] Subjective:  Pt reports that he has had worsening nightmares, poor sleep, and depression with anxiety. Pt reports that his uncle committed suicide in front of him and that he has had PTSD since that time. Pt denies SI, HI, and AVH, contracts for safety. Pt does report AH within the past few days (not current), hearing his mother's voice and notes that she died around this time of year in 91. Pt rates anxiety and depression at a maximum, stating that the above stressor is affecting him in addition to being homeless. Pt appears eager to participate in outpatient follow-up upon discharge and is open to medication adjustments to address his nightmares and worsening depression/anxiety.   HPI: History provided by the patient. Patient is a 53 year old male with past history of paranoid schizophrenia presenting with increased depression and suicidal ideation. Patient states symptoms have been worsening over the past few days. States they are related to the stresses of being homeless. He was recently staying at [facility] but he was kicked out due to his length of stay and that his "time was up". Patient denies having any plan of SI. Denies any other complaints. No recent illnesses. Patient has not been on any of his medications for the past one to 2 weeks. Does report some occasional marijuana use. Also smokes tobacco. Denies other drug or alcohol use.    Diagnosis:   DSM5: Schizophrenia Disorders:  Schizophrenia Trauma-Stressor Disorders:  Posttraumatic Stress Disorder (309.81) Substance/Addictive Disorders:  Cannabis Use Disorder - Moderate 9304.30) Depressive Disorders:  Major Depressive Disorder - with Psychotic Features (296.24) Total Time spent with patient: Greater than 30 minutes  Axis I: Major Depression, Recurrent severe, Post Traumatic Stress Disorder and Social Anxiety, Schizophrenia Axis II: Deferred Axis III:   Past Medical History  Diagnosis Date  . Paranoid schizophrenia   . Schizophrenia    Axis IV: economic problems, educational problems, housing problems, occupational problems, other psychosocial or environmental problems, problems related to legal system/crime, problems related to social environment, problems with access to health care services and problems with primary support group Axis V: 41-50 serious symptoms  ADL's:  Intact  Sleep: Fair  Appetite:  Good  Suicidal Ideation:  Denies  Homicidal Ideation:  Denies AEB (as evidenced by):  Psychiatric Specialty Exam: Physical Exam Full Physical Exam performed in ED; reviewed, stable, and I concur with this assessment.   Review of Systems  Constitutional: Negative.   HENT: Negative.   Eyes: Negative.   Respiratory: Negative.   Cardiovascular: Negative.   Gastrointestinal: Negative.   Genitourinary: Negative.   Musculoskeletal: Negative.   Skin: Negative.   Neurological: Negative.   Endo/Heme/Allergies: Negative.   Psychiatric/Behavioral: Positive for depression. Negative for suicidal ideas and hallucinations. The patient is nervous/anxious and has insomnia.     Blood pressure 128/78, pulse 76, temperature 97.9 F (36.6 C), temperature source Oral, resp. rate 18, height 5\' 7"  (1.702 m), weight 73.483 kg (162 lb), SpO2 98.00%.Body mass index is 25.37 kg/(m^2).  General Appearance: Disheveled  Eye Contact::  Good  Speech:  Clear and Coherent  Volume:  Normal  Mood:  Anxious and Depressed  Affect:  Appropriate and Depressed  Thought Process:  Coherent  Orientation:  Full (Time, Place, and Person)  Thought Content:  WDL  Suicidal Thoughts:  No  Homicidal Thoughts:  No  Memory:  Immediate;   Good Recent;   Good Remote;   Good  Judgement:  Fair  Insight:  Fair  Psychomotor Activity:  Normal  Concentration:  Good  Recall:  Good  Fund of Knowledge:Fair  Language: Fair  Akathisia:  NA  Handed:    AIMS (if indicated):      Assets:  Desire for Improvement Resilience  Sleep:      Musculoskeletal: Strength & Muscle Tone: within normal limits Gait & Station: normal Patient leans: N/A  Current Medications: Current Facility-Administered Medications  Medication Dose Route Frequency Provider Last Rate Last Dose  . acetaminophen (TYLENOL) tablet 650 mg  650 mg Oral Q6H PRN Beau FannyJohn C Tavarious Freel, FNP      . alum & mag hydroxide-simeth (MAALOX/MYLANTA) 200-200-20 MG/5ML suspension 30 mL  30 mL Oral Q4H PRN Beau FannyJohn C Aunesti Pellegrino, FNP      . ARIPiprazole (ABILIFY) tablet 20 mg  20 mg Oral Daily Beau FannyJohn C Carnie Bruemmer, FNP      . cephALEXin (KEFLEX) capsule 500 mg  500 mg Oral QID Beau FannyJohn C Gillermo Poch, FNP      . citalopram (CELEXA) tablet 20 mg  20 mg Oral Daily Beau FannyJohn C Vin Yonke, FNP      . hydrOXYzine (ATARAX/VISTARIL) tablet 25 mg  25 mg Oral TID PRN Beau FannyJohn C Dennys Guin, FNP      . magnesium hydroxide (MILK OF MAGNESIA) suspension 30 mL  30 mL Oral Daily PRN Beau FannyJohn C Evian Salguero, FNP        Lab Results: No results found for this or any previous visit (from the past 48 hour(s)).  Physical Findings: AIMS: Facial and Oral Movements Muscles of Facial Expression: None, normal Lips and Perioral Area: None, normal Jaw: None, normal Tongue: None, normal,Extremity Movements Upper (arms, wrists, hands, fingers): None, normal Lower (legs, knees, ankles, toes): None, normal, Trunk Movements Neck, shoulders, hips: None, normal, Overall Severity Severity of abnormal movements (highest score from questions above): None, normal Incapacitation due to abnormal movements: None, normal Patient's awareness of abnormal movements (rate only patient's report): No Awareness, Dental Status Current problems with teeth and/or dentures?: No Does patient usually wear dentures?: No  CIWA:    COWS:     Treatment Plan Summary: Daily contact with patient to assess and evaluate symptoms and progress in treatment Medication management  Plan: Review of chart, vital signs,  medications, and notes.  1-Individual and group therapy  2-Medication management for depression and anxiety: Medications reviewed with the patient and she stated no untoward effects.  -Increase Celexa to 30mg  daily for depression -Start Minipress 2mg  qhs for insomnia  3-Coping skills for depression, anxiety  4-Continue crisis stabilization and management  5-Address health issues--monitoring vital signs, stable  6-Treatment plan in progress to prevent relapse of depression and anxiety  Medical Decision Making Problem Points:  Established problem, stable/improving (1) and Review of psycho-social stressors (1) Data Points:  Review or order clinical lab tests (1) Review or order medicine tests (1) Review of medication regiment & side effects (2) Review of new medications or change in dosage (2)   Suicide Risk Admission Assessment     Nursing information obtained from:    Demographic factors:    Current Mental Status:    Loss Factors:    Historical Factors:    Risk Reduction Factors:    Total Time spent with patient: Greater than 30 minutes  CLINICAL FACTORS:   Severe Anxiety and/or Agitation Depression:   Hopelessness Insomnia Schizophrenia:   Depressive state More than one psychiatric diagnosis Unstable or Poor Therapeutic Relationship Previous Psychiatric Diagnoses and Treatments Medical Diagnoses and  Treatments/Surgeries  Psychiatric Specialty Exam:    Musculoskeletal: See Above  COGNITIVE FEATURES THAT CONTRIBUTE TO RISK:  Closed-mindedness    SUICIDE RISK:   Moderate:  Frequent suicidal ideation with limited intensity, and duration, some specificity in terms of plans, no associated intent, good self-control, limited dysphoria/symptomatology, some risk factors present, and identifiable protective factors, including available and accessible social support.  PLAN OF CARE: See Above  Beau FannyJohn C Zarra Geffert, FNP-BC 08/17/2013, 2:08 PM

## 2013-08-17 NOTE — Progress Notes (Addendum)
CSW spoke with pt in regards to his visit to ED. Pt shared that he has been depressed because he has been homeless for one year. Pt has [facility] with [facility] and they have been prescribing psychotropic medications. Pt shared his next appointment [facility] will be in July. CSW made referral to [facility]. A program that will provide wrap around services, psychiatry, SW, RN,permanent housing resources and Substance Abuse services. Pt consented to pursue. Referral made. Pt denied SI but reports he continues to be depressed. Pt reported he has not been taking medications as prescribed.CSW spoke with pt on the importance of taking medications as prescribed in order to achieve the desired effect.   [address], [place] [phone]

## 2013-08-17 NOTE — ED Notes (Signed)
[facility] called will be here to pick up pt.

## 2013-08-17 NOTE — Progress Notes (Signed)
Patient ID: [name] [name], male   DOB: Feb 22, 1961, 53 y.o.   MRN: [id number]  D: Patient pleasant and cooperative with care; no s/s of distress noted at this time. Pt brightens on approach A: Continuous observation for safety, encourage interaction with staff, administer medications as ordered. R: Pt denies SI or plans to harm himself. No complaints at this time.

## 2013-08-17 NOTE — BH Assessment (Signed)
[facility] Assessment Progress Note   Per Claudette Headonrad Withrow, NP pt is to remain in Observation overnight. After consulting with Renata Capriceonrad I spoke to the pt at 17:30. He reports that he is a current client of [facility], where he receives both psychiatry and therapy. He reports that he believes he has an appointment with his therapist on 09/27/2013. He will not see a psychiatrist until some time in 10/2013. He also reports that while at [facility] he filled out an application for ACT Team support through [facility]. Pt signed a Consent to Release Information form allowing me to call these providers to establish the dates and times of his appointments. Both agencies were closed by the time I called them.   I also discussed pt's housing situation with him. Until recently he lived in [facility] [place], but he has reached the limit of his eligibility for services from them.  He reports that he has two brothers and a sister that would probably be willing to take him into their homes. However, [place] in [place], [place], [place], and [place], [place]. Not only does he lack transportation to their homes, but he has lost track of all of them and does not know how to reach them. He has performed Internet searches, and has been unable to find up-to-date phone numbers for any other them.  He has a friend that he knows from his time in the shelter that will soon have an apartment in which to live and would be willing to have the pt as a roommate.  However, pt reports that he has a couple recent felonies on his record and may not meet application criteria.  He has considered applying for intermediate term housing through [facility], but has not obtained an application up to this time.  He has considered similar services through the [facility], but is averse to the crowds that he would have to contend with in order to receive housing services through them.  [place]  [facility].  However, he is unfamiliar with [place] and would much rather stay in [place], even without shelter.  Observation Unit staff are asked to do the following tomorrow, 08/18/2013:  *Call the [facility] outpatient clinic at [phone].  Let them know that pt has signed Consent to Release Information to them, then ask for the time and date of his scheduled appointments for psychiatry and counseling to enter into pt's discharge instructions.  *Call [facility] at [phone], again informing them that pt has signed Consent to Release Information to them.  Ask about what needs to be done to secure ACT Team services for the pt.  *[facility] at [phone].  Ask them if they can fax an application for transitional housing to [facility].  If so, have pt fill it out at [facility] and fax it back.  Doylene Canninghomas Quianna Avery, MA Triage Specialist 08/17/2013 @ 18:44

## 2013-08-17 NOTE — ED Notes (Signed)
Belongings 2 bags and medications given to [facility]

## 2013-08-17 NOTE — BH Assessment (Signed)
[facility], per Junious Dresseronnie.  Dossie Arbour-Eliannah Hinde, MA  Disposition MHT

## 2013-08-17 NOTE — BH Assessment (Signed)
Pt is still on waiting list at [facility]; No [facility] bed, per Revonda StandardAllison.  Dossie Arbour-Arren Laminack, MA   Disposition [facility]

## 2013-08-17 NOTE — Progress Notes (Signed)
Patient [facility]. Patient denies SI/HI and auditory and visual hallucinations. The patient is endorsing depression and states that he "just feels depressed all of the time." The patient has been off medications for two weeks and is currently homeless. [facility] but had to leave due to exceeding the stay period. The patient was given education regarding unit rules and regulations. Notified NP regarding patient arrival and need for orders. Will continue to monitor patient for safety.

## 2013-08-18 MED ORDER — ARIPIPRAZOLE 20 MG PO TABS
20.0000 mg | ORAL_TABLET | Freq: Every day | ORAL | Status: DC
Start: 2013-08-18 — End: 2013-09-07

## 2013-08-18 MED ORDER — PRAZOSIN HCL 2 MG PO CAPS: 2.0000 mg | ORAL_CAPSULE | Freq: Every day | ORAL | Status: DC

## 2013-08-18 MED ORDER — CITALOPRAM HYDROBROMIDE 10 MG PO TABS
30.0000 mg | ORAL_TABLET | Freq: Every day | ORAL | Status: DC
Start: 2013-08-18 — End: 2013-09-07

## 2013-08-18 MED ORDER — CEPHALEXIN 500 MG PO CAPS: 500.0000 mg | ORAL_CAPSULE | Freq: Four times a day (QID) | ORAL | Status: DC

## 2013-08-18 NOTE — H&P (Signed)
Case discussed, agree with plan 

## 2013-08-18 NOTE — Discharge Summary (Signed)
Patient ID: [name] [name], male   DOB: 1961/04/14, 53 y.o.   MRN: [id number] [facility] O[facility] DISCHARGE NOTE & SRA  08/18/2013 11:30 AM [name] [name]  MRN:  [id number] Subjective:  Pt reports that his nightmares and sleep have improved. Reports good appetite. Pt denies SI, HI, and AVH, contracts for safety. Pt affirms that his medication regimen is working well and that he is going to go to followup outpatient therapy. Pt is sent out with a bus pass and resources. Pt minimizes depression and anxiety, yet appears to be very calm.   HPI: History provided by the patient. Patient is a 53 year old male with past history of paranoid schizophrenia presenting with increased depression and suicidal ideation. Patient states symptoms have been worsening over the past few days. States they are related to the stresses of being homeless. He was [facility] at [facility] but he was kicked out due to his length of stay and that his "time was up". Patient denies having any plan of SI. Denies any other complaints. No recent illnesses. Patient has not been on any of his medications for the past one to 2 weeks. Does report some occasional marijuana use. Also smokes tobacco. Denies other drug or alcohol use.    Diagnosis:   DSM5: Schizophrenia Disorders:  Schizophrenia Trauma-Stressor Disorders:  Posttraumatic Stress Disorder (309.81) Substance/Addictive Disorders:  Cannabis Use Disorder - Moderate 9304.30) Depressive Disorders:  Major Depressive Disorder - with Psychotic Features (296.24) Total Time spent with patient: Greater than 30 minutes  Axis I: Major Depression, Recurrent severe, Post Traumatic Stress Disorder and Social Anxiety, Schizophrenia Axis II: Deferred Axis III:  Past Medical History  Diagnosis Date  . Paranoid schizophrenia   . Schizophrenia    Axis IV: economic problems, educational problems, housing problems, occupational problems, other psychosocial or environmental problems, problems  related to legal system/crime, problems related to social environment, problems with access to health care services and problems with primary support group Axis V: 61-70 mild symptoms  ADL's:  Intact  Sleep: Fair  Appetite:  Good  Suicidal Ideation:  Denies  Homicidal Ideation:  Denies AEB (as evidenced by):  Psychiatric Specialty Exam: Physical Exam Full Physical Exam performed in ED; reviewed, stable, and I concur with this assessment.   Review of Systems  Constitutional: Negative.   HENT: Negative.   Eyes: Negative.   Respiratory: Negative.   Cardiovascular: Negative.   Gastrointestinal: Negative.   Genitourinary: Negative.   Musculoskeletal: Negative.   Skin: Negative.   Neurological: Negative.   Endo/Heme/Allergies: Negative.   Psychiatric/Behavioral: Positive for depression. Negative for suicidal ideas and hallucinations. The patient is nervous/anxious and has insomnia.     Blood pressure 99/65, pulse 69, temperature 98.3 F (36.8 C), temperature source Oral, resp. rate 16, height 5\' 7"  (1.702 m), weight 73.483 kg (162 lb), SpO2 98.00%.Body mass index is 25.37 kg/(m^2).  SEE PSE BELOW     Musculoskeletal: SEE BELOW  Current Medications: Current Facility-Administered Medications  Medication Dose Route Frequency Provider Last Rate Last Dose  . acetaminophen (TYLENOL) tablet 650 mg  650 mg Oral Q6H PRN Beau FannyJohn C Carynn Felling, FNP      . alum & mag hydroxide-simeth (MAALOX/MYLANTA) 200-200-20 MG/5ML suspension 30 mL  30 mL Oral Q4H PRN Beau FannyJohn C Aleem Elza, FNP      . ARIPiprazole (ABILIFY) tablet 20 mg  20 mg Oral Daily Beau FannyJohn C Orlie Cundari, FNP   20 mg at 08/18/13 0739  . cephALEXin (KEFLEX) capsule 500 mg  500 mg Oral QID  Beau Fanny, FNP   500 mg at 08/18/13 1122  . citalopram (CELEXA) tablet 30 mg  30 mg Oral Daily Beau Fanny, FNP   30 mg at 08/18/13 1610  . hydrOXYzine (ATARAX/VISTARIL) tablet 25 mg  25 mg Oral TID PRN Beau Fanny, FNP   25 mg at 08/17/13 2048  .  magnesium hydroxide (MILK OF MAGNESIA) suspension 30 mL  30 mL Oral Daily PRN Beau Fanny, FNP      . prazosin (MINIPRESS) capsule 2 mg  2 mg Oral QHS Beau Fanny, FNP        Lab Results: No results found for this or any previous visit (from the past 48 hour(s)).  Physical Findings: AIMS: Facial and Oral Movements Muscles of Facial Expression: None, normal Lips and Perioral Area: None, normal Jaw: None, normal Tongue: None, normal,Extremity Movements Upper (arms, wrists, hands, fingers): None, normal Lower (legs, knees, ankles, toes): None, normal, Trunk Movements Neck, shoulders, hips: None, normal, Overall Severity Severity of abnormal movements (highest score from questions above): None, normal Incapacitation due to abnormal movements: None, normal Patient's awareness of abnormal movements (rate only patient's report): No Awareness, Dental Status Current problems with teeth and/or dentures?: No Does patient usually wear dentures?: No  CIWA:    COWS:  COWS Total Score: 0  Treatment Plan Summary: Daily contact with patient to assess and evaluate symptoms and progress in treatment Medication management  Plan: Review of chart, vital signs, medications, and notes.  1-Individual and group therapy  2-Medication management for depression and anxiety: Medications reviewed with the patient and she stated no untoward effects.  -Continue Celexa to 30mg  daily for depression -Continue Minipress 2mg  qhs for insomnia  3-Coping skills for depression, anxiety  4-Continue crisis stabilization and management  5-Address health issues--monitoring vital signs, stable  6-Treatment plan in progress to prevent relapse of depression and anxiety  Medical Decision Making Problem Points:  Established problem, stable/improving (1) and Review of psycho-social stressors (1) Data Points:  Review or order clinical lab tests (1) Review or order medicine tests (1) Review of medication regiment & side  effects (2) Review of new medications or change in dosage (2)    Suicide Risk Discharge Assessment     Demographic Factors:  Male, Caucasian, Low socioeconomic status and Unemployed  Total Time spent with patient: 30 minutes  Psychiatric Specialty Exam:     Blood pressure 99/65, pulse 69, temperature 98.3 F (36.8 C), temperature source Oral, resp. rate 16, height 5\' 7"  (1.702 m), weight 73.483 kg (162 lb), SpO2 98.00%.Body mass index is 25.37 kg/(m^2).  General Appearance: Disheveled  Eye Contact::  Good  Speech:  Clear and Coherent  Volume:  Normal  Mood:  Depressed  Affect:  Congruent  Thought Process:  Coherent  Orientation:  Full (Time, Place, and Person)  Thought Content:  WDL  Suicidal Thoughts:  No  Homicidal Thoughts:  No  Memory:  Immediate;   Fair Recent;   Fair Remote;   Fair  Judgement:  Fair  Insight:  Fair  Psychomotor Activity:  Normal  Concentration:  Good  Recall:  Fair  Fund of Knowledge:Good  Language: Good  Akathisia:  NA  Handed:  Right  AIMS (if indicated):     Assets:  Communication Skills Desire for Improvement Resilience  Sleep:       Musculoskeletal: Strength & Muscle Tone: within normal limits Gait & Station: normal Patient leans: N/A   Mental Status Per Nursing Assessment::   On  Admission:     Current Mental Status by Physician: Stable for discharge.  Loss Factors: Financial problems/change in socioeconomic status  Historical Factors: Family history of mental illness or substance abuse and Impulsivity  Risk Reduction Factors:   Positive coping skills or problem solving skills  Continued Clinical Symptoms:  More than one psychiatric diagnosis Unstable or Poor Therapeutic Relationship Previous Psychiatric Diagnoses and Treatments Medical Diagnoses and Treatments/Surgeries  Cognitive Features That Contribute To Risk:  Polarized thinking    Suicide Risk:  Minimal: No identifiable suicidal ideation.  Patients  presenting with no risk factors but with morbid ruminations; may be classified as minimal risk based on the severity of the depressive symptoms  Discharge Diagnoses: Schizophrenia Disorders:  Schizophrenia Trauma-Stressor Disorders:  Posttraumatic Stress Disorder (309.81) Substance/Addictive Disorders:  Cannabis Use Disorder - Moderate 9304.30) Depressive Disorders:  Major Depressive Disorder - with Psychotic Features (296.24) Total Time spent with patient: Greater than 30 minutes  Axis I: Major Depression, Recurrent severe, Post Traumatic Stress Disorder and Social Anxiety, Schizophrenia Axis II: Deferred Axis III:  Past Medical History  Diagnosis Date  . Paranoid schizophrenia   . Schizophrenia    Axis IV: economic problems, educational problems, housing problems, occupational problems, other psychosocial or environmental problems, problems related to legal system/crime, problems related to social environment, problems with access to health care services and problems with primary support group Axis V: 61-70 mild symptoms  Plan Of Care/Follow-up recommendations:  Activity:  As tolerated Diet:  Heart healthy with low sodium.  Is patient on multiple antipsychotic therapies at discharge:  No   Has Patient had three or more failed trials of antipsychotic monotherapy by history:  No  Recommended Plan for Multiple Antipsychotic Therapies: NA   Coulter Oldaker C Kaizlee Carlino, FNP-BC 08/18/2013, 11:30 AM

## 2013-08-18 NOTE — Progress Notes (Signed)
[facility] INPATIENT:  Family/Significant Other Suicide Prevention Education  Suicide Prevention Education:  Patient Refusal for Family/Significant Other Suicide Prevention Education: The patient [name] [name] has refused to provide written consent for family/significant other to be provided Family/Significant Other Suicide Prevention Education during admission and/or prior to discharge.  Physician notified.  Veatrice Bourbonroy M Maurica Omura 08/18/2013, 10:57 AM

## 2013-08-18 NOTE — Progress Notes (Signed)
Pt discharged per MD orders; pt currently denies SI/HI and auditory/visual hallucinations; pt was given education by RN regarding follow-up appointments and medications and pt denied any questions or concerns about these instructions; pt was then escorted to search room to retrieve his belongings by RN before being discharged to hospital lobby. 

## 2013-08-27 NOTE — Discharge Summary (Signed)
Case discussed, agree with plan 

## 2013-08-29 ENCOUNTER — Encounter (HOSPITAL_COMMUNITY): Payer: Self-pay | Admitting: Emergency Medicine

## 2013-08-29 ENCOUNTER — Emergency Department (HOSPITAL_COMMUNITY)
Admission: EM | Admit: 2013-08-29 | Discharge: 2013-08-30 | Disposition: A | Discharge status: Other Acute Inpatient Hospital | Payer: Self-pay | Attending: Emergency Medicine | Admitting: Emergency Medicine

## 2013-08-29 DIAGNOSIS — F200 Paranoid schizophrenia: Secondary | ICD-10-CM | POA: Insufficient documentation

## 2013-08-29 DIAGNOSIS — F339 Major depressive disorder, recurrent, unspecified: Secondary | ICD-10-CM | POA: Insufficient documentation

## 2013-08-29 DIAGNOSIS — Z79899 Other long term (current) drug therapy: Secondary | ICD-10-CM | POA: Insufficient documentation

## 2013-08-29 DIAGNOSIS — Z872 Personal history of diseases of the skin and subcutaneous tissue: Secondary | ICD-10-CM | POA: Insufficient documentation

## 2013-08-29 DIAGNOSIS — Z5900 Homelessness unspecified: Secondary | ICD-10-CM | POA: Insufficient documentation

## 2013-08-29 DIAGNOSIS — R45851 Suicidal ideations: Secondary | ICD-10-CM | POA: Insufficient documentation

## 2013-08-29 DIAGNOSIS — F17200 Nicotine dependence, unspecified, uncomplicated: Secondary | ICD-10-CM | POA: Insufficient documentation

## 2013-08-29 DIAGNOSIS — Z792 Long term (current) use of antibiotics: Secondary | ICD-10-CM | POA: Insufficient documentation

## 2013-08-29 LAB — RAPID URINE DRUG SCREEN, HOSP PERFORMED
AMPHETAMINES: NOT DETECTED
BENZODIAZEPINES: NOT DETECTED
Barbiturates: NOT DETECTED
COCAINE: NOT DETECTED
Opiates: NOT DETECTED
TETRAHYDROCANNABINOL: POSITIVE — AB

## 2013-08-29 LAB — CBC
HCT: 42.1 % (ref 39.0–52.0)
HEMOGLOBIN: 14.6 g/dL (ref 13.0–17.0)
MCH: 32.2 pg (ref 26.0–34.0)
MCHC: 34.7 g/dL (ref 30.0–36.0)
MCV: 92.9 fL (ref 78.0–100.0)
Platelets: 312 10*3/uL (ref 150–400)
RBC: 4.53 MIL/uL (ref 4.22–5.81)
RDW: 13.6 % (ref 11.5–15.5)
WBC: 10.2 10*3/uL (ref 4.0–10.5)

## 2013-08-29 LAB — COMPREHENSIVE METABOLIC PANEL
ALBUMIN: 3.9 g/dL (ref 3.5–5.2)
ALK PHOS: 102 U/L (ref 39–117)
ALT: 14 U/L (ref 0–53)
AST: 21 U/L (ref 0–37)
BUN: 10 mg/dL (ref 6–23)
CHLORIDE: 102 meq/L (ref 96–112)
CO2: 23 mEq/L (ref 19–32)
Calcium: 9.5 mg/dL (ref 8.4–10.5)
Creatinine, Ser: 0.79 mg/dL (ref 0.50–1.35)
GFR calc Af Amer: >90 mL/min (ref >90)
GFR calc non Af Amer: >90 mL/min (ref >90)
Glucose, Bld: 92 mg/dL (ref 70–99)
POTASSIUM: 4 meq/L (ref 3.7–5.3)
Sodium: 138 mEq/L (ref 137–147)
TOTAL PROTEIN: 7.9 g/dL (ref 6.0–8.3)
Total Bilirubin: 0.2 mg/dL — ABNORMAL LOW (ref 0.3–1.2)

## 2013-08-29 LAB — ETHANOL: Alcohol, Ethyl (B): <11 mg/dL (ref 0–11)

## 2013-08-29 LAB — SALICYLATE LEVEL: Salicylate Lvl: <2 mg/dL — ABNORMAL LOW (ref 2.8–20.0)

## 2013-08-29 LAB — ACETAMINOPHEN LEVEL

## 2013-08-29 MED ORDER — ALUM & MAG HYDROXIDE-SIMETH 200-200-20 MG/5ML PO SUSP: 30.0000 mL | ORAL | Status: DC | PRN

## 2013-08-29 MED ORDER — ACETAMINOPHEN 325 MG PO TABS: 650.0000 mg | ORAL_TABLET | ORAL | Status: DC | PRN

## 2013-08-29 MED ORDER — ARIPIPRAZOLE 10 MG PO TABS
20.0000 mg | ORAL_TABLET | Freq: Every day | ORAL | Status: DC
  Administered 2013-08-30: 20 mg via ORAL
  Filled 2013-08-29: qty 2

## 2013-08-29 MED ORDER — LORAZEPAM 1 MG PO TABS: 1.0000 mg | ORAL_TABLET | Freq: Three times a day (TID) | ORAL | Status: DC | PRN

## 2013-08-29 MED ORDER — NICOTINE 21 MG/24HR TD PT24
21.0000 mg | MEDICATED_PATCH | Freq: Every day | TRANSDERMAL | Status: DC
  Administered 2013-08-30: 21 mg via TRANSDERMAL
  Filled 2013-08-29: qty 1

## 2013-08-29 MED ORDER — CITALOPRAM HYDROBROMIDE 10 MG PO TABS
30.0000 mg | ORAL_TABLET | Freq: Every day | ORAL | Status: DC
  Administered 2013-08-30: 30 mg via ORAL
  Filled 2013-08-29: qty 3

## 2013-08-29 MED ORDER — ONDANSETRON HCL 4 MG PO TABS: 4.0000 mg | ORAL_TABLET | Freq: Three times a day (TID) | ORAL | Status: DC | PRN

## 2013-08-29 MED ORDER — PRAZOSIN HCL 2 MG PO CAPS
2.0000 mg | ORAL_CAPSULE | Freq: Every day | ORAL | Status: DC
  Administered 2013-08-30: 2 mg via ORAL
  Filled 2013-08-29 (×2): qty 1

## 2013-08-29 NOTE — BH Assessment (Signed)
[facility] Assessment Progress Note  Spoke with Dr. Bernette MayersSheldon and took history and set up telepsych appt.

## 2013-08-29 NOTE — Progress Notes (Signed)
[facility] initiated bed placement for inpatient treatment at the following hospitals with bed availability:  1)[facility]-no beds due to renovations 2)[facility]-no beds 3)[facility]-no beds 4)[facility]-faxed referral 5)[facility]-no beds 6)[facility]-faxed referral 7)[facility]-no outside referrals 8)[facility]-faxed referral 9)[facility]-faxed referral  Blain PaisMichelle L Yasser Hepp, MHT/NS

## 2013-08-29 NOTE — ED Notes (Signed)
Pt reports being suicidal, having thoughts of jumping off a bridge. Also recent rash and itching.
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
## 2013-08-29 NOTE — ED Provider Notes (Signed)
CSN: [id number]     Arrival date & time 08/29/13  1504 History   First MD Initiated Contact with Patient 08/29/13 1543     Chief Complaint  Patient presents with  . Suicidal  . Medical Clearance     (Consider location/radiation/quality/duration/timing/severity/associated sxs/prior Treatment) HPI Pt with long history of chronic depression and suicidal ideation has been homeless since earlier this year and has had several ED [facility] most recently 2-3 weeks ago, [facility] and discharged. [facility] who recently increased his dose. He reports continued suicidal ideation with plan to jump off a bridge. This is not significantly changed from baseline. He also has had itchy rash on back of neck and scalp. Treated for ?folliculitis recently.   Past Medical History  Diagnosis Date  . Paranoid schizophrenia   . Schizophrenia    History reviewed. No pertinent past surgical history. Family History  Problem Relation Age of Onset  . Diabetes Mother   . Hypertension Mother   . Cancer Father    History  Substance Use Topics  . Smoking status: Current Every Day Smoker -- 1.50 packs/day for 40 years    Types: Cigarettes  . Smokeless tobacco: Never Used  . Alcohol Use: No    Review of Systems  All other systems reviewed and are negative except as noted in HPI.    Allergies  Review of patient's allergies indicates no known allergies.  Home Medications   Prior to Admission medications   Medication Sig Start Date End Date Taking? Authorizing Provider  ARIPiprazole (ABILIFY) 20 MG tablet Take 1 tablet (20 mg total) by mouth daily. 08/18/13   Beau FannyJohn C Withrow, FNP  cephALEXin (KEFLEX) 500 MG capsule Take 1 capsule (500 mg total) by mouth 4 (four) times daily. 08/18/13   Beau FannyJohn C Withrow, FNP  citalopram (CELEXA) 10 MG tablet Take 3 tablets (30 mg total) by mouth daily. 08/18/13   Beau FannyJohn C Withrow, FNP  prazosin (MINIPRESS) 2 MG capsule Take 1  capsule (2 mg total) by mouth at bedtime. 08/18/13   Everardo AllJohn C Withrow, FNP   BP 125/80  Pulse 90  Temp(Src) 98.2 F (36.8 C) (Oral)  Resp 16  Wt 167 lb (75.751 kg)  SpO2 97% Physical Exam  Nursing note and vitals reviewed. Constitutional: He is oriented to person, place, and time. He appears well-developed and well-nourished.  HENT:  Head: Normocephalic and atraumatic.  Eyes: EOM are normal. Pupils are equal, round, and reactive to light.  Neck: Normal range of motion. Neck supple.  Cardiovascular: Normal rate, normal heart sounds and intact distal pulses.   Pulmonary/Chest: Effort normal and breath sounds normal.  Abdominal: Bowel sounds are normal. He exhibits no distension. There is no tenderness.  Musculoskeletal: Normal range of motion. He exhibits no edema and no tenderness.  Neurological: He is alert and oriented to person, place, and time. He has normal strength. No cranial nerve deficit or sensory deficit.  Skin: Skin is warm and dry. No rash noted.  Healing folliculitis of posterior neck, extensive dry scalp skin  Psychiatric: He has a normal mood and affect.    ED Course  Procedures (including critical care time) Labs Review Labs Reviewed  CBC  ACETAMINOPHEN LEVEL  COMPREHENSIVE METABOLIC PANEL  ETHANOL  SALICYLATE LEVEL  URINE RAPID DRUG SCREEN (HOSP PERFORMED)    Imaging Review No results found.   EKG Interpretation None      MDM   Final diagnoses:  None   Labs in triage ordered. TTS consult. Move to Pod C for clearance and Psych eval.   Garyn Arlotta B. Bernette MayersSheldon, MD 08/29/13 2342

## 2013-08-29 NOTE — ED Notes (Signed)
TTS being done now 

## 2013-08-29 NOTE — ED Notes (Signed)
Pt here for depression, states meds not working.  Had a plan to jump off bridge.

## 2013-08-30 ENCOUNTER — Encounter (HOSPITAL_COMMUNITY): Payer: Self-pay | Admitting: *Deleted

## 2013-08-30 ENCOUNTER — Inpatient Hospital Stay (HOSPITAL_COMMUNITY)
Admission: AD | Admit: 2013-08-30 | Discharge: 2013-09-07 | DRG: 885 | Disposition: A | Discharge status: Home / Self Care | Payer: Federal, State, Local not specified - Other | Source: Intra-hospital | Attending: Psychiatry | Admitting: Psychiatry

## 2013-08-30 DIAGNOSIS — F411 Generalized anxiety disorder: Secondary | ICD-10-CM | POA: Diagnosis present

## 2013-08-30 DIAGNOSIS — F1220 Cannabis dependence, uncomplicated: Secondary | ICD-10-CM | POA: Diagnosis present

## 2013-08-30 DIAGNOSIS — F1994 Other psychoactive substance use, unspecified with psychoactive substance-induced mood disorder: Secondary | ICD-10-CM | POA: Diagnosis present

## 2013-08-30 DIAGNOSIS — F200 Paranoid schizophrenia: Secondary | ICD-10-CM | POA: Diagnosis present

## 2013-08-30 DIAGNOSIS — F4310 Post-traumatic stress disorder, unspecified: Secondary | ICD-10-CM | POA: Diagnosis present

## 2013-08-30 DIAGNOSIS — Z5987 Material hardship due to limited financial resources, not elsewhere classified: Secondary | ICD-10-CM

## 2013-08-30 DIAGNOSIS — Z833 Family history of diabetes mellitus: Secondary | ICD-10-CM

## 2013-08-30 DIAGNOSIS — Z8249 Family history of ischemic heart disease and other diseases of the circulatory system: Secondary | ICD-10-CM

## 2013-08-30 DIAGNOSIS — F1910 Other psychoactive substance abuse, uncomplicated: Secondary | ICD-10-CM

## 2013-08-30 DIAGNOSIS — F333 Major depressive disorder, recurrent, severe with psychotic symptoms: Principal | ICD-10-CM | POA: Diagnosis present

## 2013-08-30 DIAGNOSIS — Z598 Other problems related to housing and economic circumstances: Secondary | ICD-10-CM

## 2013-08-30 DIAGNOSIS — F320 Major depressive disorder, single episode, mild: Secondary | ICD-10-CM

## 2013-08-30 DIAGNOSIS — Z5900 Homelessness unspecified: Secondary | ICD-10-CM

## 2013-08-30 DIAGNOSIS — F17200 Nicotine dependence, unspecified, uncomplicated: Secondary | ICD-10-CM | POA: Diagnosis present

## 2013-08-30 DIAGNOSIS — F329 Major depressive disorder, single episode, unspecified: Secondary | ICD-10-CM | POA: Diagnosis present

## 2013-08-30 DIAGNOSIS — R45851 Suicidal ideations: Secondary | ICD-10-CM

## 2013-08-30 DIAGNOSIS — F1210 Cannabis abuse, uncomplicated: Secondary | ICD-10-CM

## 2013-08-30 DIAGNOSIS — F339 Major depressive disorder, recurrent, unspecified: Secondary | ICD-10-CM

## 2013-08-30 MED ORDER — ARIPIPRAZOLE 10 MG PO TABS
20.0000 mg | ORAL_TABLET | Freq: Every day | ORAL | Status: DC
  Administered 2013-08-31 – 2013-09-02 (×3): 20 mg via ORAL
  Filled 2013-08-30: qty 2
  Filled 2013-08-30: qty 1
  Filled 2013-08-30 (×4): qty 2

## 2013-08-30 MED ORDER — ARIPIPRAZOLE 10 MG PO TABS: 20.0000 mg | ORAL_TABLET | Freq: Every day | ORAL | Status: DC

## 2013-08-30 MED ORDER — CITALOPRAM HYDROBROMIDE 20 MG PO TABS
30.0000 mg | ORAL_TABLET | Freq: Every day | ORAL | Status: DC
  Administered 2013-08-31 – 2013-09-07 (×8): 30 mg via ORAL
  Filled 2013-08-30 (×12): qty 1

## 2013-08-30 MED ORDER — CITALOPRAM HYDROBROMIDE 20 MG PO TABS: 30.0000 mg | ORAL_TABLET | Freq: Every day | ORAL | Status: DC

## 2013-08-30 MED ORDER — PRAZOSIN HCL 2 MG PO CAPS
2.0000 mg | ORAL_CAPSULE | Freq: Every day | ORAL | Status: DC
  Administered 2013-08-30 – 2013-09-04 (×6): 2 mg via ORAL
  Filled 2013-08-30 (×2): qty 1
  Filled 2013-08-30: qty 2
  Filled 2013-08-30: qty 1
  Filled 2013-08-30: qty 2
  Filled 2013-08-30 (×4): qty 1

## 2013-08-30 NOTE — Progress Notes (Signed)
Patient ID: [name] [name], male   DOB: Apr 26, 1961, 53 y.o.   MRN: [id number]  53 year old white male admitted [facility] reporting SI with plan to jump off bridge. Pt reports that in his everyday life he thinks about jumping off a bridge, even more when he is crossing over a bridge. Pt reported that he spends most of his days depressed, with no will to live. Pt reported that he does not drink alcohol, however does smoke THC to numb his pain. At time of admission patient reported being negative SI/HI, no AH/VH noted.

## 2013-08-30 NOTE — Progress Notes (Signed)
Patient ID: [name] [name], male   DOB: [birth year], 53 y.o.   MRN: [id number]  Pt with dull, flat affect endorsing depression. No s/s of distress noted at this time. Pt denies SI currently.

## 2013-08-30 NOTE — Progress Notes (Signed)
[facility] INPATIENT:  Family/Significant Other Suicide Prevention Education  Suicide Prevention Education:  Patient Refusal for Family/Significant Other Suicide Prevention Education: The patient [name] [name] has refused to provide written consent for family/significant other to be provided Family/Significant Other Suicide Prevention Education during admission.  Kindel Rochefort Shanta Don Tiu 08/30/2013, 1:05 PM

## 2013-08-30 NOTE — Plan of Care (Signed)
[facility] Observation Crisis Plan  Reason for Crisis Plan:  Crisis Stabilization   Plan of Care:  Referral for Telepsychiatry/Psychiatric Consult  Family Support:      Current Living Environment:  Living Arrangements: Other (Comment) (homeless)  Insurance:   Hospital Account   Name Acct ID Class Status Primary Coverage   [name] [name], [name] [name] [id number] BEHAVIORAL HEALTH OBSERVATION Open None        Guarantor Account (for Hospital Account [id number])   Name Relation to Pt Service Area Active? Acct Type   [name] [name], [name] [name] [facility]   Address Phone       [address] [place], [place] [address] [phone])          Coverage Information (for Hospital Account [id number])   Not on file      Legal Guardian:     Primary Care Provider:  No PCP Per Patient  Current [facility]  Psychiatrist:     Counselor/Therapist:     Compliant with Medications:  Yes  Additional Information:   [name] [name] 4/26/201512:44 PM

## 2013-08-30 NOTE — BH Assessment (Addendum)
[facility] Assessment Progress Note  Spoke with pt via tele assessment.  Pt continues to endorse SI with a plan and cannot contract for safety.  Pt states that nothing has changes since last assessment late yesterday afternoon--still has no HI, A/V hallucinations.  Pt states that he has been comfortable in ED.  After assessment, Berneice Heinrichina Tate informed writer that Nanine MeansJamison Lord, NP [facility].  Notified Becky, RN and ED staff. [facility].
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
## 2013-08-31 ENCOUNTER — Inpatient Hospital Stay (HOSPITAL_COMMUNITY): Admission: AD | Admit: 2013-08-31 | Payer: Self-pay | Source: Intra-hospital | Admitting: Psychiatry

## 2013-08-31 DIAGNOSIS — F209 Schizophrenia, unspecified: Secondary | ICD-10-CM

## 2013-08-31 DIAGNOSIS — F411 Generalized anxiety disorder: Secondary | ICD-10-CM

## 2013-08-31 DIAGNOSIS — F329 Major depressive disorder, single episode, unspecified: Secondary | ICD-10-CM | POA: Diagnosis present

## 2013-08-31 DIAGNOSIS — F4310 Post-traumatic stress disorder, unspecified: Secondary | ICD-10-CM

## 2013-08-31 MED ORDER — MAGNESIUM HYDROXIDE 400 MG/5ML PO SUSP: 30.0000 mL | Freq: Every day | ORAL | Status: DC | PRN

## 2013-08-31 MED ORDER — ACETAMINOPHEN 325 MG PO TABS: 650.0000 mg | ORAL_TABLET | Freq: Four times a day (QID) | ORAL | Status: DC | PRN

## 2013-08-31 MED ORDER — ALUM & MAG HYDROXIDE-SIMETH 200-200-20 MG/5ML PO SUSP: 30.0000 mL | ORAL | Status: DC | PRN

## 2013-08-31 NOTE — Progress Notes (Signed)
D: Patient denies SI/HI and auditory and visual hallucinations. Patient has a depressed mood and affect.  Anxious.  A: Patient given emotional support from RN. Patient given medications per MD orders. Patient encouraged to come to staff with any questions or concerns.  R: Patient remains cooperative and appropriate. Will continue to monitor patient for safety.

## 2013-08-31 NOTE — B[facility] Counselor (Signed)
Adult Psychosocial Assessment Update Interdisciplinary Team  Previous [facility] admissions/discharges:  Admissions Discharges  Date: 08/17/13 Date: 08/18/13-obs unit   Date: 06/18/13 Date: 06/25/13  Date: 05/24/13 Date: 06/01/13  Date: Date:  Date: Date:   Changes since the last Psychosocial Assessment (including adherence to outpatient mental health and/or substance abuse treatment, situational issues contributing to decompensation and/or relapse). Patient admitted for suicidal ideations, no plan. This appears to be his baseline with frequent trips to the ED. [name] [name] was [facility]. If his suicidal ideations do not resolve, placement elsewhere will be needed due to lack of response to [facility]. He is homeless and can not go back to the shelter because he has been there too long. In 6 months, he can go back. Denies homicidal ideations and hallucinations.              Discharge Plan 1. Will you be returning to the same living situation after discharge?   Yes: No:      If no, what is your plan?    Pt is unable to go back to [facility] but reports that he has a friend that will allow him to stay in [place] with him until his disability gets approved.        2. Would you like a referral for services when you are discharged? Yes:     If yes, for what services?  No:       Pt hoping for referral for services. Pt reports that [facility] and is compliant with meds but is still suffering from depression and constant SI with plan. Pt may be eligible for ACT services. CSW assessing for appropriate referrals.        Summary and Recommendations (to be completed by the evaluator) Pt is 53 year old male living in [place], [place]. Pt reports that he is homeless and recently was kicked out of [facility] due to length of time staying at the shelter. Pt presents to [facility] due to SI with plan, mood stabilization, and medication management. Pt  reports occassional THC use but reports no other substance abuse issues/no alcohol abuse. Recommendations for pt include: crisis stabilization, therapeutic milieu, encourage group attendance and participation, medication management for mood stabilization, and development of comprehensive mental wellness plan. Pt plans to attend groups at the [facility] health [facility] and would like referral for services-possibly ACT team.                        Signature:  Trula SladeHeather Smart, LCSWA  08/31/2013 3:48 PM

## 2013-08-31 NOTE — Progress Notes (Addendum)
Patient ID: [name] [name], male   DOB: 06/17/1960, 53 y.o.   MRN: [id number] He has been up and to group this PM has no c/o discomfort.

## 2013-08-31 NOTE — Progress Notes (Signed)
Patient ID: [name] [name], male   DOB: 01-29-1961, 53 y.o.   MRN: [id number] He was admitted from the OPS unit. He was calm cooperative. Voiced that he was having suicidal thoughts but he contracts for safety. Stated that he had  PTSD, has a h/o depression ,anxiety, paranoid schizpohrenia and schizophrenia. He is homeless. PTSD from seeing his uncle kill himself when patient was 8 and he said that "His mother died in his arm. His plan was to jump off of a bridge. He was here recently.

## 2013-08-31 NOTE — Progress Notes (Signed)
Patient ID: [name] [name], male   DOB: 06-25-1960, 53 y.o.   MRN: [id number] [facility] OBS PROGRESS NOTE & SRA  08/31/2013 11:32 AM [name] [name]  MRN:  [id number] Subjective:  Pt seen and chart reviewed. Pt continues to report suicidal ideation with plans to jump off a bridge. Pt states that he passes a bridge often and constantly thinks of jumping off of it. Pt cannot contract for safety at this time. Pt denies HI and AVH. Pt appears to be very depressed during this assessment.   HPI: Patient admitted for suicidal ideations, no plan. This appears to be his baseline with frequent trips to the ED. [name] [name] was discharged from [facility] 2 weeks ago. He is homeless and can not go back to the shelter because he has been there too long. In 6 months, he can go back. Denies homicidal ideations and hallucinations.   Diagnosis:   DSM5: Schizophrenia Disorders:  Schizophrenia Trauma-Stressor Disorders:  Posttraumatic Stress Disorder (309.81) Substance/Addictive Disorders:  Cannabis Use Disorder - Moderate 9304.30) Depressive Disorders:  Major Depressive Disorder - Severe (296.23) Total Time spent with patient: Greater than 30 minutes  Axis I: Major Depression, Recurrent severe, Post Traumatic Stress Disorder and Social Anxiety, Schizophrenia Axis II: Deferred Axis III:  Past Medical History  Diagnosis Date  . Paranoid schizophrenia   . Schizophrenia    Axis IV: economic problems, educational problems, housing problems, occupational problems, other psychosocial or environmental problems, problems related to legal system/crime, problems related to social environment, problems with access to health care services and problems with primary support group Axis V: 41-50 serious symptoms  ADL's:  Intact  Sleep: Fair  Appetite:  Good  Suicidal Ideation:  Denies  Homicidal Ideation:  Denies AEB (as evidenced by):  Psychiatric Specialty Exam: Physical Exam Full Physical Exam performed in ED; reviewed,  stable, and I concur with this assessment.   Review of Systems  Constitutional: Negative.   HENT: Negative.   Eyes: Negative.   Respiratory: Negative.   Cardiovascular: Negative.   Gastrointestinal: Negative.   Genitourinary: Negative.   Musculoskeletal: Negative.   Skin: Negative.   Neurological: Negative.   Endo/Heme/Allergies: Negative.   Psychiatric/Behavioral: Positive for depression. Negative for suicidal ideas and hallucinations. The patient is nervous/anxious and has insomnia.     Blood pressure 112/76, pulse 78, temperature 97.1 F (36.2 C), temperature source Oral, resp. rate 97.There is no weight on file to calculate BMI.  SEE PSE BELOW     Musculoskeletal: SEE BELOW  Current Medications: Current Facility-Administered Medications  Medication Dose Route Frequency Provider Last Rate Last Dose  . acetaminophen (TYLENOL) tablet 650 mg  650 mg Oral Q6H PRN Benjamine Mola, FNP      . alum & mag hydroxide-simeth (MAALOX/MYLANTA) 200-200-20 MG/5ML suspension 30 mL  30 mL Oral Q4H PRN Benjamine Mola, FNP      . ARIPiprazole (ABILIFY) tablet 20 mg  20 mg Oral Daily Waylan Boga, NP   20 mg at 08/31/13 0806  . citalopram (CELEXA) tablet 30 mg  30 mg Oral Daily Waylan Boga, NP   30 mg at 08/31/13 0806  . magnesium hydroxide (MILK OF MAGNESIA) suspension 30 mL  30 mL Oral Daily PRN Benjamine Mola, FNP      . prazosin (MINIPRESS) capsule 2 mg  2 mg Oral QHS Waylan Boga, NP   2 mg at 08/30/13 2121    Lab Results:  Results for orders placed during the hospital encounter of 08/29/13 (from the  past 48 hour(s))  ACETAMINOPHEN LEVEL     Status: None   Collection Time    08/29/13  3:34 PM      Result Value Ref Range   Acetaminophen (Tylenol), Serum <15.0  10 - 30 ug/mL   Comment:            THERAPEUTIC CONCENTRATIONS VARY     SIGNIFICANTLY. A RANGE OF 10-30     ug/mL MAY BE AN EFFECTIVE     CONCENTRATION FOR MANY PATIENTS.     HOWEVER, SOME ARE BEST TREATED     AT  CONCENTRATIONS OUTSIDE THIS     RANGE.     ACETAMINOPHEN CONCENTRATIONS     >150 ug/mL AT 4 HOURS AFTER     INGESTION AND >50 ug/mL AT 12     HOURS AFTER INGESTION ARE     OFTEN ASSOCIATED WITH TOXIC     REACTIONS.  CBC     Status: None   Collection Time    08/29/13  3:34 PM      Result Value Ref Range   WBC 10.2  4.0 - 10.5 K/uL   RBC 4.53  4.22 - 5.81 MIL/uL   Hemoglobin 14.6  13.0 - 17.0 g/dL   HCT 42.1  39.0 - 52.0 %   MCV 92.9  78.0 - 100.0 fL   MCH 32.2  26.0 - 34.0 pg   MCHC 34.7  30.0 - 36.0 g/dL   RDW 13.6  11.5 - 15.5 %   Platelets 312  150 - 400 K/uL  COMPREHENSIVE METABOLIC PANEL     Status: Abnormal   Collection Time    08/29/13  3:34 PM      Result Value Ref Range   Sodium 138  137 - 147 mEq/L   Potassium 4.0  3.7 - 5.3 mEq/L   Chloride 102  96 - 112 mEq/L   CO2 23  19 - 32 mEq/L   Glucose, Bld 92  70 - 99 mg/dL   BUN 10  6 - 23 mg/dL   Creatinine, Ser 0.79  0.50 - 1.35 mg/dL   Calcium 9.5  8.4 - 10.5 mg/dL   Total Protein 7.9  6.0 - 8.3 g/dL   Albumin 3.9  3.5 - 5.2 g/dL   AST 21  0 - 37 U/L   ALT 14  0 - 53 U/L   Alkaline Phosphatase 102  39 - 117 U/L   Total Bilirubin 0.2 (*) 0.3 - 1.2 mg/dL   GFR calc non Af Amer >90  >90 mL/min   GFR calc Af Amer >90  >90 mL/min   Comment: (NOTE)     The eGFR has been calculated using the CKD EPI equation.     This calculation has not been validated in all clinical situations.     eGFR's persistently <90 mL/min signify possible Chronic Kidney     Disease.  ETHANOL     Status: None   Collection Time    08/29/13  3:34 PM      Result Value Ref Range   Alcohol, Ethyl (B) <11  0 - 11 mg/dL   Comment:            LOWEST DETECTABLE LIMIT FOR     SERUM ALCOHOL IS 11 mg/dL     FOR MEDICAL PURPOSES ONLY  SALICYLATE LEVEL     Status: Abnormal   Collection Time    08/29/13  3:34 PM      Result Value Ref Range  Salicylate Lvl <2.7 (*) 2.8 - 20.0 mg/dL  URINE RAPID DRUG SCREEN (HOSP PERFORMED)     Status: Abnormal    Collection Time    08/29/13  4:12 PM      Result Value Ref Range   Opiates NONE DETECTED  NONE DETECTED   Cocaine NONE DETECTED  NONE DETECTED   Benzodiazepines NONE DETECTED  NONE DETECTED   Amphetamines NONE DETECTED  NONE DETECTED   Tetrahydrocannabinol POSITIVE (*) NONE DETECTED   Barbiturates NONE DETECTED  NONE DETECTED   Comment:            DRUG SCREEN FOR MEDICAL PURPOSES     ONLY.  IF CONFIRMATION IS NEEDED     FOR ANY PURPOSE, NOTIFY LAB     WITHIN 5 DAYS.                LOWEST DETECTABLE LIMITS     FOR URINE DRUG SCREEN     Drug Class       Cutoff (ng/mL)     Amphetamine      1000     Barbiturate      200     Benzodiazepine   517     Tricyclics       001     Opiates          300     Cocaine          300     THC              50    Physical Findings: AIMS: Facial and Oral Movements Muscles of Facial Expression: None, normal Lips and Perioral Area: None, normal Jaw: None, normal Tongue: None, normal,Extremity Movements Upper (arms, wrists, hands, fingers): None, normal Lower (legs, knees, ankles, toes): None, normal, Trunk Movements Neck, shoulders, hips: None, normal, Overall Severity Severity of abnormal movements (highest score from questions above): None, normal Incapacitation due to abnormal movements: None, normal Patient's awareness of abnormal movements (rate only patient's report): No Awareness, Dental Status Current problems with teeth and/or dentures?: No Does patient usually wear dentures?: No  CIWA:    COWS:     Treatment Plan Summary: Daily contact with patient to assess and evaluate symptoms and progress in treatment Medication management  Plan: Review of chart, vital signs, medications, and notes.  1-Medication management for depression and anxiety: Medications reviewed with the patient and she stated no untoward effects, unchanged. 2-Coping skills for depression, anxiety  3-Continue crisis stabilization and management  4-Address health  issues--monitoring vital signs, stable  5-Treatment plan in progress to prevent relapse of depression and anxiety  Medical Decision Making Problem Points:  Established problem, worsening (2) and Review of psycho-social stressors (1) Data Points:  Review or order clinical lab tests (1) Review or order medicine tests (1) Review of medication regiment & side effects (2) Review of new medications or change in dosage (2)    Suicide Risk Assessment     Demographic Factors:  Male, Caucasian, Low socioeconomic status and Unemployed  Total Time spent with patient: 30 minutes  Psychiatric Specialty Exam:     Blood pressure 112/76, pulse 78, temperature 97.1 F (36.2 C), temperature source Oral, resp. rate 97.There is no weight on file to calculate BMI.  General Appearance: Disheveled  Eye Contact::  Good  Speech:  Clear and Coherent  Volume:  Normal  Mood:  Depressed  Affect:  Congruent  Thought Process:  Coherent  Orientation:  Full (Time, Place, and Person)  Thought Content:  WDL  Suicidal Thoughts:  Yes.  with intent/plan "jump off a bridge"  Homicidal Thoughts:  No  Memory:  Immediate;   Fair Recent;   Fair Remote;   Fair  Judgement:  Fair  Insight:  Fair  Psychomotor Activity:  Normal  Concentration:  Good  Recall:  [name] [name] of Knowledge:Good  Language: Good  Akathisia:  NA  Handed:  Right  AIMS (if indicated):     Assets:  Communication Skills Desire for Improvement Resilience  Sleep:       Musculoskeletal: Strength & Muscle Tone: within normal limits Gait & Station: normal Patient leans: N/A   Mental Status Per Nursing Assessment::   On Admission:  Suicidal ideation indicated by patient  Current Mental Status by Physician: Stable for discharge.  Loss Factors: Financial problems/change in socioeconomic status  Historical Factors: Family history of mental illness or substance abuse and Impulsivity  Risk Reduction Factors:   Positive coping skills  or problem solving skills  Continued Clinical Symptoms:  More than one psychiatric diagnosis Unstable or Poor Therapeutic Relationship Previous Psychiatric Diagnoses and Treatments Medical Diagnoses and Treatments/Surgeries  Cognitive Features That Contribute To Risk:  Polarized thinking    Suicide Risk:  Moderate  Plan Of Care/Follow-up recommendations:  Activity:  As tolerated Diet:  Heart healthy with low sodium.  Is patient on multiple antipsychotic therapies at discharge:  No   Has Patient had three or more failed trials of antipsychotic monotherapy by history:  No  Recommended Plan for Multiple Antipsychotic Therapies: NA   Elyse Jarvis Jalen Daluz, FNP-BC 08/31/2013, 11:32 AM

## 2013-08-31 NOTE — BH Assessment (Signed)
[facility] Assessment Progress Note  After consulting with Claudette Headonrad Withrow, NP it has been determined that pt presents a life threatening danger to himself for which psychiatric hospitalization is indicated.  [facility] to the service of Geoffery LyonsIrving Lugo, MD, Rm 302-1, per Thurman CoyerEric Kaplan, RN, [facility].  He will notify Observation Unit staff when pt is ready for transfer.  Pt signed Voluntary Admission and Consent for Treatment.  Doylene Canninghomas Jolyne Laye, MA Triage Specialist 08/31/2013 @ 12:14

## 2013-08-31 NOTE — Progress Notes (Signed)
Patient transferred to Adult 300 hall. Escorted to unit by Maury Dusonna RN.

## 2013-08-31 NOTE — [facility] Group Notes (Signed)
[facility] LCSW Group Therapy  08/31/2013 3:28 PM  Type of Therapy:  Group Therapy  Participation Level:  Did Not Attend-pt new arrival on unit. Came to group for last five minutes but did not participate.   [name] [name] LCSWA  08/31/2013, 3:28 PM

## 2013-09-01 ENCOUNTER — Encounter (HOSPITAL_COMMUNITY): Payer: Self-pay | Admitting: Psychiatry
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
## 2013-09-01 NOTE — Progress Notes (Signed)
NUTRITION ASSESSMENT  Pt identified as at risk on the Malnutrition Screen Tool  INTERVENTION: 1. Educated patient on the importance of nutrition and encouraged intake of food and beverages. 2. Discussed weight goals. 3. Supplements: none at this time.  NUTRITION DIAGNOSIS: Unintentional weight loss related to sub-optimal intake as evidenced by pt report.   Goal: Pt to meet >/= 90% of their estimated nutrition needs.  Monitor:  PO intake  Assessment:  Patient admitted with SI.  Homeless.  [facility]  Hx includes:  PTSD, Cannabis use disorder, schizophrenia.  Patient reports decreased appetite prior to admit but with adequate intake.  Reports UBW of 160-168 and weight is stable.  Eating very well currently.   53 y.o. male  Height: Ht Readings from Last 1 Encounters:  08/17/13 5\' 7"  (1.702 m)    Weight: Wt Readings from Last 1 Encounters:  08/29/13 167 lb (75.751 kg)    Weight Hx: Wt Readings from Last 10 Encounters:  08/29/13 167 lb (75.751 kg)  08/17/13 162 lb (73.483 kg)  08/14/13 168 lb (76.204 kg)  08/13/13 168 lb 5 oz (76.346 kg)  06/29/13 161 lb (73.029 kg)  06/18/13 161 lb (73.029 kg)  06/15/13 165 lb (74.844 kg)  05/24/13 159 lb (72.122 kg)    BMI:  26 Patient's weight appears normal based on body frame and structure.  Estimated Nutritional Needs: Kcal: 25-30 kcal/kg Protein: > 1 gram protein/kg Fluid: 1 ml/kcal  Diet Order: General Pt is also offered choice of unit snacks mid-morning and mid-afternoon.  Pt is eating as desired.   Lab results and medications reviewed.   Oran ReinLaura Lecil Tapp, RD, LDN Clinical Inpatient Dietitian Pager:  [phone] Weekend and after hours pager:  [phone]

## 2013-09-01 NOTE — [facility] Suicide Risk Assessment (Signed)
Suicide Risk Assessment  Admission Assessment     Nursing information obtained from:  Patient Demographic factors:  Male;Caucasian Current Mental Status:  Suicidal ideation indicated by patient Loss Factors:  Financial problems / change in socioeconomic status Historical Factors:  Prior suicide attempts Risk Reduction Factors:  NA Total Time spent with patient: 45 minutes  CLINICAL FACTORS:   Depression:   Severe Alcohol/Substance Abuse/Dependencies  Psychiatric Specialty Exam:     Blood pressure 120/75, pulse 103, temperature 97.9 F (36.6 C), temperature source Oral, resp. rate 16.There is no weight on file to calculate BMI.  General Appearance: Fairly Groomed  Patent attorneyye Contact::  Fair  Speech:  Clear and Coherent, Slow and not spontaneous  Volume:  Decreased  Mood:  Depressed  Affect:  Restricted  Thought Process:  Coherent and Goal Directed  Orientation:  Full (Time, Place, and Person)  Thought Content:  symtpoms, worries, concerns  Suicidal Thoughts:  Yes, no intent or plan  Homicidal Thoughts:  No  Memory:  Immediate;   Fair Recent;   Fair Remote;   Fair  Judgement:  Fair  Insight:  Present and Shallow  Psychomotor Activity:  Psychomotor Retardation  Concentration:  Fair  Recall:  [employer]  Fund of Knowledge:NA  Language: Fair  Akathisia:  No  Handed:    AIMS (if indicated):     Assets:  Desire for Improvement  Sleep:  Number of Hours: 6.75   Musculoskeletal: Strength & Muscle Tone: within normal limits Gait & Station: normal Patient leans: N/A  COGNITIVE FEATURES THAT CONTRIBUTE TO RISK:  Closed-mindedness Polarized thinking Thought constriction (tunnel vision)    SUICIDE RISK: Moderate   PLAN OF CARE: Supportive approach/coping skills                               Optimize treatment with psychotropics  I certify that inpatient services furnished can reasonably be expected to improve the patient's condition.  Rachael Feerving A Jaquis Picklesimer 09/01/2013, 5:59 PM

## 2013-09-01 NOTE — Progress Notes (Signed)
Adult Psychoeducational Group Note  Date:  09/01/2013 Time:  11:43 PM  Group Topic/Focus:  Self Care:   The focus of this group is to help patients understand the importance of self-care in order to improve or restore emotional, physical, spiritual, interpersonal, and financial health.  Participation Level:  None  Participation Quality:  did not attend  Affect:  did not attend  Cognitive:  did not attend  Insight: None  Engagement in Group:  did not attend  Modes of Intervention:  Support  Additional Comments:  AA/NA guest speaker. Pt did not attend group.  [name] [name] 09/01/2013, 11:43 PM

## 2013-09-01 NOTE — Progress Notes (Signed)
The focus of this group is to educate the patient on the purpose and policies of crisis stabilization and provide a format to answer questions about their admission.  The group details unit policies and expectations of patients while admitted. Patient did not attend this group. 

## 2013-09-01 NOTE — Progress Notes (Signed)
Patient ID: [name] [name], male   DOB: 10/27/1960, 53 y.o.   MRN: [id number] He has been in bed most of day wil get up for meals and medications, said that he had not slept well last night.Self inventory depression 10, hopelessness 8, denies withdrawal symptoms, positive for SI, contracts for safety here.  He has not requested any prn medications.

## 2013-09-01 NOTE — [facility] Group Notes (Signed)
[facility] LCSW Group Therapy  09/01/2013 1:15 PM   Type of Therapy:  Group Therapy  Participation Level:  Did Not Attend - pt sleeping in his room  Maddi Collar Horton, LCSW 09/01/2013 2:39 PM  

## 2013-09-01 NOTE — Progress Notes (Signed)
Recreation Therapy Notes  Animal-Assisted Activity/Therapy (AAA/T) Program Checklist/Progress Notes Patient Eligibility Criteria Checklist & Daily Group note for Rec Tx Intervention  Date: 04.28.2015 Time: 2:45pm [address]    AAA/T Program Assumption of Risk Form signed by Patient/ or Parent Legal Guardian yes  Patient is free of allergies or sever asthma yes  Patient reports no fear of animals yes  Patient reports no history of cruelty to animals yes   Patient understands his/her participation is voluntary yes  Behavioral Response: DID NOT ATTEND   Jearl KlinefelterDenise L Tykerria Mccubbins, LRT/CTRS  Jearl KlinefelterDenise L Shrihan Putt 09/01/2013 4:38 PM

## 2013-09-01 NOTE — Progress Notes (Signed)
D Pt. Reports he has had a bad day and had been in his room most of the day. Continues to endorse passive SI.  A Writer offers support ane encouragement, contracts for safety with Retail bankerthe writer.    R Pt. Remains safe on the unit.  Pt. Is coming out of his room and interacting with his peers this pm.

## 2013-09-01 NOTE — Progress Notes (Signed)
Pt attend AA group this evening.  

## 2013-09-01 NOTE — Progress Notes (Signed)
D   Pt is pleasant on approach and requested a medication to help him with anxiety and insomnia   He reports minimal signs and symptoms of withdrawal   He attends groups and is active in same  He interacts appropriately with others A   Verbal support given    Medications administered and effectiveness monitored   Q 15 min checks R   Pt safe at present

## 2013-09-02 DIAGNOSIS — F1220 Cannabis dependence, uncomplicated: Secondary | ICD-10-CM

## 2013-09-02 DIAGNOSIS — F411 Generalized anxiety disorder: Secondary | ICD-10-CM

## 2013-09-02 DIAGNOSIS — F322 Major depressive disorder, single episode, severe without psychotic features: Secondary | ICD-10-CM

## 2013-09-02 MED ORDER — DIVALPROEX SODIUM 250 MG PO DR TAB
250.0000 mg | DELAYED_RELEASE_TABLET | Freq: Three times a day (TID) | ORAL | Status: DC
  Administered 2013-09-02 – 2013-09-07 (×16): 250 mg via ORAL
  Filled 2013-09-02 (×5): qty 1
  Filled 2013-09-02 (×3): qty 42
  Filled 2013-09-02 (×15): qty 1

## 2013-09-02 MED ORDER — BUPROPION HCL ER (XL) 150 MG PO TB24
150.0000 mg | ORAL_TABLET | Freq: Every day | ORAL | Status: DC
  Administered 2013-09-02 – 2013-09-07 (×6): 150 mg via ORAL
  Filled 2013-09-02 (×5): qty 1
  Filled 2013-09-02: qty 14
  Filled 2013-09-02 (×3): qty 1

## 2013-09-02 NOTE — Progress Notes (Signed)
Patient appeared disheveled at the beginning of this shift. Mood and affects sad and depressed. He denied SI/HI and denied Hallucinations. He endorsed a descent day, denied SI/HI and denied hallucinations. Writer encouraged and supported patient. Q 15 minute check continues asordered to maintain safety.

## 2013-09-02 NOTE — [facility] Group Notes (Signed)
[facility] LCSW Group Therapy  09/02/2013 2:52 PM  Type of Therapy:  Group Therapy  Participation Level:  Did Not Attend-pt came to group as we were concluding. He sat quietly but did not participate.   Riham Polyakov Smart LCSWA  09/02/2013, 2:52 PM

## 2013-09-02 NOTE — Progress Notes (Signed)
Adult Psychoeducational Group Note  Date:  09/02/2013 Time:  10:09 PM  Group Topic/Focus:  NA group  Participation Level:  Active  Additional Comments: Pt attended NA group  Kimberely Mccannon 09/02/2013, 10:09 PM

## 2013-09-02 NOTE — [facility] Group Notes (Signed)
[facility]   09/02/2013 11:15 AM  Participation Quality:  Appropriate   Mood/Affect:  Appropriate  Depression Rating:  8  Anxiety Rating:  3  Thoughts of Suicide:  Yes Will you contract for safety?   Yes  Current AVH:  No  Plan for Discharge/Comments:  Pt reports passive SI and states that he wants to be put on depression medication. [name] reports that he plans to stay with a friend at d/c and [facility], [facility], and [facility] info. [facility]   Transportation Means: bus   Supports: one friend identified as support.   Elleanna Melling Smart  [employer] IncLCSWA

## 2013-09-02 NOTE — Progress Notes (Signed)
Patient ID: [name] [name], male   DOB: 05/02/1961, 53 y.o.   MRN: [id number] He has been up and to groups more today interacting more today. Said that he feels a little better. Self inventory: depression 8, hopelessness 8, denies W/D symptoms and has SI thoughts off and on.

## 2013-09-02 NOTE — Progress Notes (Signed)
[facility]  09/02/2013 5:13 PM [name] [name] L Miranda  MRN:  [id number] Subjective:  Mayo AoFlemming states that he continued to feel very depressed, anxious, with "negative thoughts." States that he has been on this combination of medications before and that they do not work for him. He does say that he was on Depakote before and that it did work for him, but he does not know why they D/C it when he went to his follow up appointment. Admits to the lack of energy, lack of motivation Diagnosis:   DSM5: Schizophrenia Disorders:  none Obsessive-Compulsive Disorders:  none Trauma-Stressor Disorders:  PTSD Substance/Addictive Disorders:  Cannabis Use Disorder - Moderate 9304.30) Depressive Disorders:  Major Depressive Disorder - Severe (296.23) Total Time spent with patient: 30 minutes  Axis I: Anxiety Disorder NOS  ADL's:  Intact  Sleep: Fair  Appetite:  Fair  Suicidal Ideation:  Plan:  denies Intent:  denies Means:  denies Homicidal Ideation:  Plan:  denies Intent:  denies Means:  denies AEB (as evidenced by):  Psychiatric Specialty Exam: Physical Exam  Review of Systems  Constitutional: Positive for malaise/fatigue.  HENT: Negative.   Eyes: Negative.   Respiratory: Negative.   Cardiovascular: Negative.   Gastrointestinal: Negative.   Genitourinary: Negative.   Musculoskeletal: Negative.   Skin: Negative.   Neurological: Positive for weakness.  Endo/Heme/Allergies: Negative.   Psychiatric/Behavioral: Positive for depression, suicidal ideas and substance abuse. The patient is nervous/anxious.     Blood pressure 102/66, pulse 91, temperature 98 F (36.7 C), temperature source Oral, resp. rate 16.There is no weight on file to calculate BMI.  General Appearance: Fairly Groomed  Patent attorneyye Contact::  Fair  Speech:  Clear and Coherent, Slow and not spontaeneous  Volume:  Decreased  Mood:  Depressed  Affect:  Restricted  Thought Process:  Coherent and Goal Directed  Orientation:   Full (Time, Place, and Person)  Thought Content:  symtpoms, worries, concerns  Suicidal Thoughts:  Intermittent when he thinks this is as good as it is going to get  Homicidal Thoughts:  No  Memory:  Immediate;   Fair Recent;   Fair Remote;   Fair  Judgement:  Fair  Insight:  Present  Psychomotor Activity:  Decreased  Concentration:  Fair  Recall:  [employer]Fair  Fund of Knowledge:NA  Language: Fair  Akathisia:  No  Handed:    AIMS (if indicated):     Assets:  Desire for Improvement  Sleep:  Number of Hours: 6.5   Musculoskeletal: Strength & Muscle Tone: within normal limits Gait & Station: normal Patient leans: N/A  Current Medications: Current Facility-Administered Medications  Medication Dose Route Frequency Provider Last Rate Last Dose  . acetaminophen (TYLENOL) tablet 650 mg  650 mg Oral Q6H PRN Beau FannyJohn C Withrow, FNP      . alum & mag hydroxide-simeth (MAALOX/MYLANTA) 200-200-20 MG/5ML suspension 30 mL  30 mL Oral Q4H PRN Beau FannyJohn C Withrow, FNP      . buPROPion (WELLBUTRIN XL) 24 hr tablet 150 mg  150 mg Oral Daily Rachael FeeIrving A Calel Pisarski, MD   150 mg at 09/02/13 1126  . citalopram (CELEXA) tablet 30 mg  30 mg Oral Daily Nanine MeansJamison Lord, NP   30 mg at 09/02/13 0807  . divalproex (DEPAKOTE) DR tablet 250 mg  250 mg Oral TID Rachael FeeIrving A Sandrika Schwinn, MD   250 mg at 09/02/13 1638  . magnesium hydroxide (MILK OF MAGNESIA) suspension 30 mL  30 mL Oral Daily PRN Beau FannyJohn C Withrow, FNP      .  prazosin (MINIPRESS) capsule 2 mg  2 mg Oral QHS Nanine MeansJamison Lord, NP   2 mg at 09/01/13 2138    Lab Results: No results found for this or any previous visit (from the past 48 hour(s)).  Physical Findings: AIMS: Facial and Oral Movements Muscles of Facial Expression: None, normal Lips and Perioral Area: None, normal Jaw: None, normal Tongue: None, normal,Extremity Movements Upper (arms, wrists, hands, fingers): None, normal Lower (legs, knees, ankles, toes): None, normal, Trunk Movements Neck, shoulders, hips: None, normal,  Overall Severity Severity of abnormal movements (highest score from questions above): None, normal Incapacitation due to abnormal movements: None, normal Patient's awareness of abnormal movements (rate only patient's report): No Awareness, Dental Status Current problems with teeth and/or dentures?: No Does patient usually wear dentures?: No  CIWA:  CIWA-Ar Total: 1 COWS:     Treatment Plan Summary: Daily contact with patient to assess and evaluate symptoms and progress in treatment Medication management  Plan: Supportive approach/coping skills/relapse prevention           Will resume the Depakote ER 250 mg TID                  Add Wellbutrin XL 150 mg in AM  Medical Decision Making Problem Points:  Established problem, worsening (2) and Review of psycho-social stressors (1) Data Points:  Review of medication regiment & side effects (2) Review of new medications or change in dosage (2)  I certify that inpatient services furnished can reasonably be expected to improve the patient's condition.   Rachael Feerving A Ruffus Kamaka 09/02/2013, 5:13 PM
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
## 2013-09-03 DIAGNOSIS — F1994 Other psychoactive substance use, unspecified with psychoactive substance-induced mood disorder: Secondary | ICD-10-CM

## 2013-09-03 MED ORDER — NICOTINE 21 MG/24HR TD PT24
21.0000 mg | MEDICATED_PATCH | Freq: Every day | TRANSDERMAL | Status: DC
  Administered 2013-09-03 – 2013-09-07 (×5): 21 mg via TRANSDERMAL
  Filled 2013-09-03: qty 1
  Filled 2013-09-03: qty 14
  Filled 2013-09-03 (×6): qty 1

## 2013-09-03 MED ORDER — ZOLPIDEM TARTRATE 10 MG PO TABS
10.0000 mg | ORAL_TABLET | Freq: Every evening | ORAL | Status: DC | PRN
  Administered 2013-09-03 – 2013-09-06 (×4): 10 mg via ORAL
  Filled 2013-09-03 (×4): qty 1

## 2013-09-03 NOTE — Progress Notes (Signed)
Patient did attend the evening karaoke group. Pt was attentive and supportive but did not participate by singing a song.   

## 2013-09-03 NOTE — Progress Notes (Signed)
[facility]  09/03/2013 5:45 PM [name] [name]  MRN:  [id number] Subjective:  States he did not sleep well last night. States he woke up trough the night, restless sleep. He is still endorsing the depressed mood "the dark thoughts' intermittent SI. States he hopes that this change in his medications will help. Diagnosis:   DSM5: Schizophrenia Disorders:  none Obsessive-Compulsive Disorders:  none Trauma-Stressor Disorders:  none Substance/Addictive Disorders:  Cannabis Use Disorder - Moderate 9304.30) Depressive Disorders:  Major Depressive Disorder - Severe (296.23) Total Time spent with patient: 30 minutes  Axis I: Substance Induced Mood Disorder  ADL's:  Intact  Sleep: Poor  Appetite:  Fair  Suicidal Ideation:  Plan:  denies Intent:  denies Means:  denies Homicidal Ideation:  Plan:  denies Intent:  denies Means:  denies AEB (as evidenced by):  Psychiatric Specialty Exam: Physical Exam  Review of Systems  Constitutional: Positive for malaise/fatigue.  HENT: Negative.   Eyes: Negative.   Respiratory: Negative.   Cardiovascular: Negative.   Gastrointestinal: Negative.   Genitourinary: Negative.   Musculoskeletal: Negative.   Skin: Negative.   Neurological: Positive for weakness.  Endo/Heme/Allergies: Negative.   Psychiatric/Behavioral: Positive for depression and substance abuse. The patient is nervous/anxious and has insomnia.     Blood pressure 119/73, pulse 85, temperature 98 F (36.7 C), temperature source Oral, resp. rate 16.There is no weight on file to calculate BMI.  General Appearance: Fairly Groomed  Patent attorneyye Contact::  Fair  Speech:  Clear and Coherent and not spontaneous  Volume:  Decreased  Mood:  Depressed  Affect:  Restricted  Thought Process:  Coherent and Goal Directed  Orientation:  Full (Time, Place, and Person)  Thought Content:  symtpoms, worries, concerns  Suicidal Thoughts:  No  Homicidal Thoughts:  No  Memory:  Immediate;    Fair Recent;   Fair Remote;   Fair  Judgement:  Fair  Insight:  Present  Psychomotor Activity:  Decreased  Concentration:  Fair  Recall:  FiservFair  Fund of Knowledge:NA  Language: Fair  Akathisia:  No  Handed:    AIMS (if indicated):     Assets:  Desire for Improvement  Sleep:  Number of Hours: 6   Musculoskeletal: Strength & Muscle Tone: within normal limits Gait & Station: normal Patient leans: N/A  Current Medications: Current Facility-Administered Medications  Medication Dose Route Frequency Provider Last Rate Last Dose  . acetaminophen (TYLENOL) tablet 650 mg  650 mg Oral Q6H PRN Beau FannyJohn C Withrow, FNP      . alum & mag hydroxide-simeth (MAALOX/MYLANTA) 200-200-20 MG/5ML suspension 30 mL  30 mL Oral Q4H PRN Beau FannyJohn C Withrow, FNP      . buPROPion (WELLBUTRIN XL) 24 hr tablet 150 mg  150 mg Oral Daily Rachael FeeIrving A Ruel Dimmick, MD   150 mg at 09/03/13 [id number]  . citalopram (CELEXA) tablet 30 mg  30 mg Oral Daily Nanine MeansJamison Lord, NP   30 mg at 09/03/13 0751  . divalproex (DEPAKOTE) DR tablet 250 mg  250 mg Oral TID Rachael FeeIrving A Lataria Courser, MD   250 mg at 09/03/13 1710  . magnesium hydroxide (MILK OF MAGNESIA) suspension 30 mL  30 mL Oral Daily PRN Beau FannyJohn C Withrow, FNP      . nicotine (NICODERM CQ - dosed in mg/24 hours) patch 21 mg  21 mg Transdermal Q0600 Kerry HoughSpencer E Simon, PA-C   21 mg at 09/03/13 0617  . prazosin (MINIPRESS) capsule 2 mg  2 mg Oral QHS Nanine MeansJamison Lord, NP  2 mg at 09/02/13 2156  . zolpidem (AMBIEN) tablet 10 mg  10 mg Oral QHS PRN Rachael FeeIrving A Safir Michalec, MD        Lab Results: No results found for this or any previous visit (from the past 48 hour(s)).  Physical Findings: AIMS: Facial and Oral Movements Muscles of Facial Expression: None, normal Lips and Perioral Area: None, normal Jaw: None, normal Tongue: None, normal,Extremity Movements Upper (arms, wrists, hands, fingers): None, normal Lower (legs, knees, ankles, toes): None, normal, Trunk Movements Neck, shoulders, hips: None, normal, Overall  Severity Severity of abnormal movements (highest score from questions above): None, normal Incapacitation due to abnormal movements: None, normal Patient's awareness of abnormal movements (rate only patient's report): No Awareness, Dental Status Current problems with teeth and/or dentures?: No Does patient usually wear dentures?: No  CIWA:  CIWA-Ar Total: 1 COWS:     Treatment Plan Summary: Daily contact with patient to assess and evaluate symptoms and progress in treatment Medication management  Plan: Supportive approach/coping skills/relapse prevention           Disturbed sleep possibly secondary to taking the Wellbutrin late           Has done on Ambien before will have Ambien available tonight just in case           Optimize response  Medical Decision Making Problem Points:  Established problem, worsening (2) and Review of psycho-social stressors (1) Data Points:  Review of new medications or change in dosage (2)  I certify that inpatient services furnished can reasonably be expected to improve the patient's condition.   Rachael Feerving A Delorise Hunkele 09/03/2013, 5:45 PM

## 2013-09-03 NOTE — Progress Notes (Signed)
Patient ID: [name] [name], male   DOB: 1961-04-14, 53 y.o.   MRN: [id number] He has been up and to groups and interacting more today. Self inventory:  Depression 8 down from 10, hopelessness 7 down from 8, continues to have SI thoughts off and on.

## 2013-09-03 NOTE — Progress Notes (Signed)
Patient ID: [name] [name], male   DOB: 05-30-1960, 53 y.o.   MRN: [id number] D: Patient continues to report depressive symptoms.  He reports passive SI, however, he contracts for safety.  He denies HI/AVH.  Patient presents with flat, blunted affect and depressed mood.  He is isolative and participates minimally in groups.  Patient has minimal contact with staff and others.  He has been compliant with his medications and plans to follow up with [facility].  A: continue to monitor medication management and MD orders.  Safety checks continues every 15 minutes per protocol.  R: patient has minimal interaction with staff.

## 2013-09-03 NOTE — [facility] Group Notes (Signed)
[facility] LCSW Group Therapy  09/03/2013 3:06 PM  Type of Therapy:  Group Therapy  Participation Level:  Minimal  Participation Quality:  Attentive  Affect:  Depressed and Flat  Cognitive:  Alert  Insight:  Improving  Engagement in Therapy:  Improving  Modes of Intervention:  Confrontation, Discussion, Education, Exploration, Problem-solving, Rapport Building, Socialization and Support  Summary of Progress/Problems:  Finding Balance in Life. Today's group focused on defining balance in one's own words, identifying things that can knock one off balance, and exploring healthy ways to maintain balance in life. Group members were asked to provide an example of a time when they felt off balance, describe how they handled that situation,and process healthier ways to regain balance in the future. Group members were asked to share the most important tool for maintaining balance that they learned [facility] and how they plan to apply this method after discharge. [name] [name] was attentive and engaged throughout today's therapy group. He actively listened as others shared but did not actively participate in group discussion unless prompted by CSW. [name] [name] shared that he finds that "listening to music and playing video games" helps him to relax and clear his mind when he feels stressed out. "I need to make time for myself and stop being hard on myself."    The Sherwin-WilliamsHeather Smart LCSWA  09/03/2013, 3:06 PM

## 2013-09-04 NOTE — [facility] Suicide Risk Assessment (Signed)
[facility] INPATIENT:  Family/Significant Other Suicide Prevention Education  Suicide Prevention Education:  Patient Refusal for Family/Significant Other Suicide Prevention Education: The patient [name] [name] has refused to provide written consent for family/significant other to be provided Family/Significant Other Suicide Prevention Education during admission and/or prior to discharge.  Physician notified.  SPE completed with pt. SPI pamphlet provided to pt and he was encouraged to share information with support network, ask questions, and talk about any concerns relating to SPE.  [name] Smart LCSWA  09/04/2013, 8:29 AM

## 2013-09-04 NOTE — [facility] Group Notes (Signed)
[facility] LCSW Group Therapy  09/04/2013 2:28 PM  Type of Therapy:  Group Therapy  Participation Level:  Did Not Attend-pt invited to attend 400 hall chaplain led group today (not programming on 300 today being that topic was PAWS and pt is not here for substance abuse issues). Pt chose not to go and rested in room, stating that he had a headache.   Laretha Luepke Smart LCSWA  09/04/2013, 2:28 PM

## 2013-09-04 NOTE — Progress Notes (Signed)
Adult Psychoeducational Group Note  Date:  09/04/2013 Time:  1:20 PM  Group Topic/Focus:  Early Warning Signs:   The focus of this group is to help patients identify signs or symptoms they exhibit before slipping into an unhealthy state or crisis.  Participation Level:  Active  Participation Quality:  Appropriate, Attentive and Supportive  Affect:  Appropriate  Cognitive:  Appropriate  Insight: Appropriate  Engagement in Group:  Engaged and Supportive  Modes of Intervention:  Discussion and Support  Additional Comments:  Pts dicussed early warning signs that they notice themselves going through when they begin to slip into an unhealthy state.  Pt was in bed asleep during the majority of group. Pt did attend the last 15 minutes of group and was engaged and supportive.  Caswell CorwinDana C Koben Daman 09/04/2013, 1:20 PM

## 2013-09-04 NOTE — [facility] Group Notes (Signed)
Adult Psychoeducational Group Note  Date:  09/04/2013 Time:  10:38 PM  Group Topic/Focus:  AA Meeting  Participation Level:  Did Not Attend  Participation Quality:  None  Affect:  None  Cognitive:  None  Insight: None  Engagement in Group:  None  Modes of Intervention:  Discussion and Education  Additional Comments:  [name] did not attend group.  [name] A [name] 09/04/2013, 10:38 PM

## 2013-09-04 NOTE — Progress Notes (Signed)
[facility]  09/04/2013 5:24 PM [name] [name]  MRN:  [id number] Subjective:  [name] [name] continues to endorse depression. Had to ask for the Ambien last night as he could not sleep. He is committed to pursue these medications further and to do everything he can not to smoke marijuana once he is out of here. States that he does not have much support out there. He is planning to stay with a roommate but does not know how long he can be there Diagnosis:   DSM5: Schizophrenia Disorders:  none Obsessive-Compulsive Disorders:  none Trauma-Stressor Disorders:  none Substance/Addictive Disorders:  Cannabis Use Disorder - Severe (304.30) Depressive Disorders:  Major Depressive Disorder - Severe (296.23) Total Time spent with patient: 30 minutes  Axis I: Anxiety Disorder NOS and Substance Induced Mood Disorder  ADL's:  Intact  Sleep: Poor  Appetite:  Fair  Suicidal Ideation:  Plan:  denies Intent:  denies Means:  denies Homicidal Ideation:  Plan:  denies Intent:  denies Means:  denies AEB (as evidenced by):  Psychiatric Specialty Exam: Physical Exam  Review of Systems  Constitutional: Positive for malaise/fatigue.  HENT: Negative.   Eyes: Negative.   Respiratory: Negative.   Cardiovascular: Negative.   Gastrointestinal: Negative.   Genitourinary: Negative.   Musculoskeletal: Negative.   Skin: Negative.   Neurological: Positive for weakness.  Endo/Heme/Allergies: Negative.   Psychiatric/Behavioral: Positive for depression and substance abuse. The patient is nervous/anxious and has insomnia.     Blood pressure 123/76, pulse 79, temperature 97.7 F (36.5 C), temperature source Oral, resp. rate 20.There is no weight on file to calculate BMI.  General Appearance: Fairly Groomed  Patent attorneyye Contact::  Fair  Speech:  Clear and Coherent, Slow and not spontaneous  Volume:  Decreased  Mood:  Depressed  Affect:  Restricted  Thought Process:  Coherent and Goal Directed  Orientation:   Full (Time, Place, and Person)  Thought Content:  symptoms, worries, concerns  Suicidal Thoughts:  No  Homicidal Thoughts:  No  Memory:  Immediate;   Fair Recent;   Fair Remote;   Fair  Judgement:  Fair  Insight:  Present and Shallow  Psychomotor Activity:  Decreased  Concentration:  Fair  Recall:  Fis[employer]Fair  Fund of Knowledge:NA  Language: Fair  Akathisia:  No  Handed:    AIMS (if indicated):     Assets:  Desire for Improvement  Sleep:  Number of Hours: 5   Musculoskeletal: Strength & Muscle Tone: within normal limits Gait & Station: normal Patient leans: N/A  Current Medications: Current Facility-Administered Medications  Medication Dose Route Frequency Provider Last Rate Last Dose  . acetaminophen (TYLENOL) tablet 650 mg  650 mg Oral Q6H PRN Beau FannyJohn C Withrow, FNP      . alum & mag hydroxide-simeth (MAALOX/MYLANTA) 200-200-20 MG/5ML suspension 30 mL  30 mL Oral Q4H PRN Beau FannyJohn C Withrow, FNP      . buPROPion (WELLBUTRIN XL) 24 hr tablet 150 mg  150 mg Oral Daily Rachael FeeIrving A Asna Muldrow, MD   150 mg at 09/04/13 0813  . citalopram (CELEXA) tablet 30 mg  30 mg Oral Daily Nanine MeansJamison Lord, NP   30 mg at 09/04/13 0813  . divalproex (DEPAKOTE) DR tablet 250 mg  250 mg Oral TID Rachael FeeIrving A Aya Geisel, MD   250 mg at 09/04/13 1703  . magnesium hydroxide (MILK OF MAGNESIA) suspension 30 mL  30 mL Oral Daily PRN Beau FannyJohn C Withrow, FNP      . nicotine (NICODERM CQ - dosed  in mg/24 hours) patch 21 mg  21 mg Transdermal Q0600 Kerry HoughSpencer E Simon, PA-C   21 mg at 09/04/13 0813  . prazosin (MINIPRESS) capsule 2 mg  2 mg Oral QHS Nanine MeansJamison Lord, NP   2 mg at 09/03/13 2159  . zolpidem (AMBIEN) tablet 10 mg  10 mg Oral QHS PRN Rachael FeeIrving A Kenslee Achorn, MD   10 mg at 09/03/13 2201    Lab Results: No results found for this or any previous visit (from the past 48 hour(s)).  Physical Findings: AIMS: Facial and Oral Movements Muscles of Facial Expression: None, normal Lips and Perioral Area: None, normal Jaw: None, normal Tongue: None,  normal,Extremity Movements Upper (arms, wrists, hands, fingers): None, normal Lower (legs, knees, ankles, toes): None, normal, Trunk Movements Neck, shoulders, hips: None, normal, Overall Severity Severity of abnormal movements (highest score from questions above): None, normal Incapacitation due to abnormal movements: None, normal Patient's awareness of abnormal movements (rate only patient's report): No Awareness, Dental Status Current problems with teeth and/or dentures?: No Does patient usually wear dentures?: No  CIWA:  CIWA-Ar Total: 1 COWS:     Treatment Plan Summary: Daily contact with patient to assess and evaluate symptoms and progress in treatment Medication management  Plan: Supportive approach/copign skills/relapse prevention           Continue to optimize treatment with psychotropics  Medical Decision Making Problem Points:  Review of psycho-social stressors (1) Data Points:  Review of medication regiment & side effects (2) Review of new medications or change in dosage (2)  I certify that inpatient services furnished can reasonably be expected to improve the patient's condition.   Rachael Feerving A Safir Michalec 09/04/2013, 5:24 PM

## 2013-09-04 NOTE — [facility] Group Notes (Signed)
[facility]   09/04/2013 9:38 AM  Participation Quality:  DID NOT ATTEND-pt sleeping in room during d/c planning group.   Jonea Bukowski Smart Am[employer] IncLCSWA

## 2013-09-04 NOTE — [facility] Group Notes (Signed)
[facility] LCSW Group Therapy  09/04/2013  1:15 PM   Type of Therapy:  Group Therapy  Participation Level:  Active  Participation Quality:  Attentive, Sharing and Supportive  Affect:  Depressed and Flat  Cognitive:  Alert and Oriented  Insight:  Developing/Improving and Engaged  Engagement in Therapy:  Developing/Improving and Engaged  Modes of Intervention:  Clarification, Confrontation, Discussion, Education, Exploration, Limit-setting, Orientation, Problem-solving, Rapport Building, Dance movement psychotherapisteality Testing, Socialization and Support  Summary of Progress/Problems: The topic for group today was care.  Pt participated in group discussion about what care means to them, in relation to caring for self and others.  Pt was able to relate to peers on this topic but did not share personally.  Pt actively engaged and listened to group discussion.    [name] IvanChelsea Horton, LCSW 09/04/2013  1:45 PM

## 2013-09-04 NOTE — Progress Notes (Signed)
Assumed care of patient at 2300. He has rested throughout the night without complaint. RR WNL, no acute distress. Level III obs in place for safety and pt is safe. [name]

## 2013-09-04 NOTE — Progress Notes (Signed)
Patient ID: [name] [name], male   DOB: 1960/05/21, 53 y.o.   MRN: [id number] Reported off to brook m.,rn....sbw,rn

## 2013-09-05 DIAGNOSIS — F339 Major depressive disorder, recurrent, unspecified: Secondary | ICD-10-CM

## 2013-09-05 DIAGNOSIS — F1210 Cannabis abuse, uncomplicated: Secondary | ICD-10-CM

## 2013-09-05 MED ORDER — PRAZOSIN HCL 2 MG PO CAPS
3.0000 mg | ORAL_CAPSULE | Freq: Every day | ORAL | Status: DC
  Administered 2013-09-05 – 2013-09-06 (×2): 3 mg via ORAL
  Filled 2013-09-05 (×4): qty 1

## 2013-09-05 MED ORDER — PRAZOSIN HCL 1 MG PO CAPS
ORAL_CAPSULE | ORAL | Status: AC
  Filled 2013-09-05: qty 1

## 2013-09-05 NOTE — Progress Notes (Signed)
D. Pt has been up and has been active while in the milieu today, attending and participating in various milieu activities. Pt has endorsed depression today and appears flat and withdrawn and also has endorsed passive SI but able to contract for safety on the unit. Pt spoke about having very little support and not sure about living situation after discharge. A. Support and encouragement provided, medication education given. R. Pt verbalized understanding, safety maintained.

## 2013-09-05 NOTE — [facility] Group Notes (Signed)
[facility] Group Notes:  (Nursing/MHT/Case Management/Adjunct)  Date:  09/05/2013  Time: 0900 am  Type of Therapy:  Self Inventory Group  Participation Level:  Did Not Attend   Cresenciano GenreCaroline Evans [facility]ry 09/05/2013, 10:33 AM

## 2013-09-05 NOTE — Progress Notes (Signed)
Pt remains flat, sad and depressed both in affect and mood. Continues to endorse SI "on and off" but denies a specific plan. Is able to contract for safety while on the unit. Medicated per orders. Given ambien per his request. Explained increase in minipress which pt verbalizes understanding of. He denies HI/AVH and remains safe at this time, resting in bed. [name]

## 2013-09-05 NOTE — [facility] Group Notes (Signed)
[facility] LCSW Group Therapy  09/05/2013 11:10 AM  Type of Therapy:  Group Therapy  Participation Level:  Did Not Attend  Participation Quality:  N/A  Affect:  N/A  Cognitive:  N/A  Insight:  N/A  Engagement in Therapy:  N/A  Modes of Intervention:  Discussion, Education, Exploration, Rapport Building and Support  Summary of Progress/Problems: Did not attend  [name] SpatesRachel Anne Ovie [name] 09/05/2013, 11:10 AM

## 2013-09-05 NOTE — Progress Notes (Signed)
Patient ID: [name] [name], male   DOB: 1960/09/19, 53 y.o.   [id number] [facility]  09/05/2013 7:05 PM [name] [name]  MRN:  [id number] Subjective:  Pt seen and chart reviewed. Pt denies HI and AVH, contracts for safety but does affirm some thoughts of "wanting to die". Pt reports very poor sleep, but states that he felt the Ambien was helping at first.    Diagnosis:   DSM5: Schizophrenia Disorders:  none Obsessive-Compulsive Disorders:  none Trauma-Stressor Disorders:  none Substance/Addictive Disorders:  Cannabis Use Disorder - Severe (304.30) Depressive Disorders:  Major Depressive Disorder - Severe (296.23) Total Time spent with patient: 30 minutes  Axis I: Anxiety Disorder NOS and Substance Induced Mood Disorder  ADL's:  Intact  Sleep: Poor  Appetite:  Fair  Suicidal Ideation:  Plan:  denies Intent:  denies Means:  denies Homicidal Ideation:  Plan:  denies Intent:  denies Means:  denies AEB (as evidenced by):  Psychiatric Specialty Exam: Physical Exam  Review of Systems  Constitutional: Positive for malaise/fatigue.  HENT: Negative.   Eyes: Negative.   Respiratory: Negative.   Cardiovascular: Negative.   Gastrointestinal: Negative.   Genitourinary: Negative.   Musculoskeletal: Negative.   Skin: Negative.   Neurological: Positive for weakness.  Endo/Heme/Allergies: Negative.   Psychiatric/Behavioral: Positive for depression and substance abuse. The patient is nervous/anxious and has insomnia.     Blood pressure 107/69, pulse 83, temperature 97.5 F (36.4 C), temperature source Oral, resp. rate 18.There is no weight on file to calculate BMI.  General Appearance: Fairly Groomed  Patent attorneyye Contact::  Fair  Speech:  Clear and Coherent, Slow and not spontaneous  Volume:  Decreased  Mood:  Depressed  Affect:  Restricted  Thought Process:  Coherent and Goal Directed  Orientation:  Full (Time, Place, and Person)  Thought Content:  symptoms, worries,  concerns  Suicidal Thoughts:  No  Homicidal Thoughts:  No  Memory:  Immediate;   Fair Recent;   Fair Remote;   Fair  Judgement:  Fair  Insight:  Present and Shallow  Psychomotor Activity:  Decreased  Concentration:  Fair  Recall:  FiservFair  Fund of Knowledge:NA  Language: Fair  Akathisia:  No  Handed:    AIMS (if indicated):     Assets:  Desire for Improvement  Sleep:  Number of Hours: 6   Musculoskeletal: Strength & Muscle Tone: within normal limits Gait & Station: normal Patient leans: N/A  Current Medications: Current Facility-Administered Medications  Medication Dose Route Frequency Provider Last Rate Last Dose  . acetaminophen (TYLENOL) tablet 650 mg  650 mg Oral Q6H PRN Beau FannyJohn C Withrow, FNP      . alum & mag hydroxide-simeth (MAALOX/MYLANTA) 200-200-20 MG/5ML suspension 30 mL  30 mL Oral Q4H PRN Beau FannyJohn C Withrow, FNP      . buPROPion (WELLBUTRIN XL) 24 hr tablet 150 mg  150 mg Oral Daily Rachael FeeIrving A Lugo, MD   150 mg at 09/05/13 0834  . citalopram (CELEXA) tablet 30 mg  30 mg Oral Daily Nanine MeansJamison Lord, NP   30 mg at 09/05/13 0834  . divalproex (DEPAKOTE) DR tablet 250 mg  250 mg Oral TID Rachael FeeIrving A Lugo, MD   250 mg at 09/05/13 1656  . magnesium hydroxide (MILK OF MAGNESIA) suspension 30 mL  30 mL Oral Daily PRN Beau FannyJohn C Withrow, FNP      . nicotine (NICODERM CQ - dosed in mg/24 hours) patch 21 mg  21 mg Transdermal Q0600 Karleen HampshireSpencer E  Simon, PA-C   21 mg at 09/05/13 [id number]  . prazosin (MINIPRESS) capsule 2 mg  2 mg Oral QHS Nanine MeansJamison Lord, NP   2 mg at 09/04/13 2159  . zolpidem (AMBIEN) tablet 10 mg  10 mg Oral QHS PRN Rachael FeeIrving A Lugo, MD   10 mg at 09/04/13 2159    Lab Results: No results found for this or any previous visit (from the past 48 hour(s)).  Physical Findings: AIMS: Facial and Oral Movements Muscles of Facial Expression: None, normal Lips and Perioral Area: None, normal Jaw: None, normal Tongue: None, normal,Extremity Movements Upper (arms, wrists, hands, fingers): None,  normal Lower (legs, knees, ankles, toes): None, normal, Trunk Movements Neck, shoulders, hips: None, normal, Overall Severity Severity of abnormal movements (highest score from questions above): None, normal Incapacitation due to abnormal movements: None, normal Patient's awareness of abnormal movements (rate only patient's report): No Awareness, Dental Status Current problems with teeth and/or dentures?: No Does patient usually wear dentures?: No  CIWA:  CIWA-Ar Total: 1 COWS:     Treatment Plan Summary: Daily contact with patient to assess and evaluate symptoms and progress in treatment Medication management  Plan: Supportive approach/copign skills/relapse prevention           Continue to optimize treatment with psychotropics  Medical Decision Making Problem Points:  Review of psycho-social stressors (1) Data Points:  Review of medication regiment & side effects (2) Review of new medications or change in dosage (2)  I certify that inpatient services furnished can reasonably be expected to improve the patient's condition.   Beau FannyJohn C Withrow, FNP-BC 09/05/2013, 7:05 PM

## 2013-09-05 NOTE — Progress Notes (Signed)
Patient ID: [name] [name], male   DOB: 1960/07/10, 53 y.o.   MRN: [id number] D: Patient continues to report depressive symptoms.  He affect is flat, blunted.  His mood is sad/depressed.  He rates his depression as a 7.  He continues to endorse hopelessness; rates it as a 7.  He reports that his SI is on/off, however, he contracts for safety.  Patient has not attended any groups this mornings.  He is a poor participant in his treatment.  He denies HI/AVH.  He is not sure where he will go after discharge.  He does not plan on any extended treatment.  A: continue to monitor medication management and MD orders.  Safety checks completed every 15 minutes per protocol.  R: patient remains isolative.

## 2013-09-05 NOTE — Progress Notes (Signed)
Adult Psychoeducational Group Note  Date:  09/05/2013 Time:  3:59 PM  Group Topic/Focus:  Therapeutic Activity   Participation Level:  Minimal  Participation Quality:  Attentive  Affect:  Appropriate  Cognitive:  Oriented  Insight: Good  Engagement in Group:  Improving  Modes of Intervention:  Activity  Additional Comments: Saturday's topic focused on healthy coping skills.  Pts played coping skills pictionary, which gives pts different ideas to use if they are experiencing a difficult time.    Elijio MilesBrittany N Magaline Steinberg 09/05/2013, 3:59 PM

## 2013-09-05 NOTE — Progress Notes (Signed)
Pt did not attend evening group. 

## 2013-09-06 NOTE — [facility] Group Notes (Signed)
[facility] Group Notes:  (Nursing/MHT/Case Management/Adjunct)  Date:  09/06/2013  Time:  11:30 AM  Type of Therapy:  Psychoeducational Skills  Participation Level:  Did Not Attend  [name] [name] 09/06/2013, 11:30 AM

## 2013-09-06 NOTE — Progress Notes (Signed)
Patient ID: [name] SheerFleming L Azeez, male   DOB: 1960-07-31, 53 y.o.   MRN: [id number] Patient ID: [name] SheerFleming L Hoston, male   DOB: 1960-07-31, 53 y.o.   MRN: [id number] [facility]  09/06/2013 5:31 PM [name] [name]  MRN:  [id number] Subjective:  Pt seen and chart reviewed. Pt denies HI and AVH, contracts for safety but continues to report feelings of "not wanting to be here" intermittently. Pt reports good sleep and good appetite at this point in time. Also, reports participating in group therapy with benefit. Pt is in agreement with medication and treatment regimen.   Diagnosis:   DSM5: Schizophrenia Disorders:  none Obsessive-Compulsive Disorders:  none Trauma-Stressor Disorders:  none Substance/Addictive Disorders:  Cannabis Use Disorder - Severe (304.30) Depressive Disorders:  Major Depressive Disorder - Severe (296.23) Total Time spent with patient: 30 minutes  Axis I: Anxiety Disorder NOS and Substance Induced Mood Disorder  ADL's:  Intact  Sleep: Poor  Appetite:  Fair  Suicidal Ideation:  Plan:  denies Intent:  denies Means:  denies Homicidal Ideation:  Plan:  denies Intent:  denies Means:  denies AEB (as evidenced by):  Psychiatric Specialty Exam: Physical Exam  Review of Systems  Constitutional: Positive for malaise/fatigue.  HENT: Negative.   Eyes: Negative.   Respiratory: Negative.   Cardiovascular: Negative.   Gastrointestinal: Negative.   Genitourinary: Negative.   Musculoskeletal: Negative.   Skin: Negative.   Neurological: Positive for weakness.  Endo/Heme/Allergies: Negative.   Psychiatric/Behavioral: Positive for depression and substance abuse. The patient is nervous/anxious and has insomnia.     Blood pressure 96/64, pulse 91, temperature 97.5 F (36.4 C), temperature source Oral, resp. rate 18.There is no weight on file to calculate BMI.  General Appearance: Fairly Groomed  Patent attorneyye Contact::  Fair  Speech:  Clear and Coherent, Slow and not spontaneous   Volume:  Decreased  Mood:  Depressed  Affect:  Restricted  Thought Process:  Coherent and Goal Directed  Orientation:  Full (Time, Place, and Person)  Thought Content:  symptoms, worries, concerns  Suicidal Thoughts:  No  Homicidal Thoughts:  No  Memory:  Immediate;   Fair Recent;   Fair Remote;   Fair  Judgement:  Fair  Insight:  Present and Shallow  Psychomotor Activity:  Decreased  Concentration:  Fair  Recall:  FiservFair  Fund of Knowledge:NA  Language: Fair  Akathisia:  No  Handed:    AIMS (if indicated):     Assets:  Desire for Improvement  Sleep:  Number of Hours: 6   Musculoskeletal: Strength & Muscle Tone: within normal limits Gait & Station: normal Patient leans: N/A  Current Medications: Current Facility-Administered Medications  Medication Dose Route Frequency Provider Last Rate Last Dose  . acetaminophen (TYLENOL) tablet 650 mg  650 mg Oral Q6H PRN Beau FannyJohn C Withrow, FNP      . alum & mag hydroxide-simeth (MAALOX/MYLANTA) 200-200-20 MG/5ML suspension 30 mL  30 mL Oral Q4H PRN Beau FannyJohn C Withrow, FNP      . buPROPion (WELLBUTRIN XL) 24 hr tablet 150 mg  150 mg Oral Daily Rachael FeeIrving A Lugo, MD   150 mg at 09/06/13 [id number]  . citalopram (CELEXA) tablet 30 mg  30 mg Oral Daily Nanine MeansJamison Lord, NP   30 mg at 09/06/13 [id number]  . divalproex (DEPAKOTE) DR tablet 250 mg  250 mg Oral TID Rachael FeeIrving A Lugo, MD   250 mg at 09/06/13 1727  . magnesium hydroxide (MILK OF MAGNESIA) suspension 30 mL  30 mL  Oral Daily PRN Beau FannyJohn C Withrow, FNP      . nicotine (NICODERM CQ - dosed in mg/24 hours) patch 21 mg  21 mg Transdermal Q0600 Kerry HoughSpencer E Simon, PA-C   21 mg at 09/06/13 [id number]  . prazosin (MINIPRESS) capsule 3 mg  3 mg Oral QHS Beau FannyJohn C Withrow, FNP   3 mg at 09/05/13 2150  . zolpidem (AMBIEN) tablet 10 mg  10 mg Oral QHS PRN Rachael FeeIrving A Lugo, MD   10 mg at 09/05/13 2150    Lab Results: No results found for this or any previous visit (from the past 48 hour(s)).  Physical Findings: AIMS: Facial and Oral  Movements Muscles of Facial Expression: None, normal Lips and Perioral Area: None, normal Jaw: None, normal Tongue: None, normal,Extremity Movements Upper (arms, wrists, hands, fingers): None, normal Lower (legs, knees, ankles, toes): None, normal, Trunk Movements Neck, shoulders, hips: None, normal, Overall Severity Severity of abnormal movements (highest score from questions above): None, normal Incapacitation due to abnormal movements: None, normal Patient's awareness of abnormal movements (rate only patient's report): No Awareness, Dental Status Current problems with teeth and/or dentures?: No Does patient usually wear dentures?: No  CIWA:  CIWA-Ar Total: 1 COWS:     Treatment Plan Summary: Daily contact with patient to assess and evaluate symptoms and progress in treatment Medication management  Plan: Supportive approach/copign skills/relapse prevention           Continue to optimize treatment with psychotropics  Medical Decision Making Problem Points:  Review of psycho-social stressors (1) Data Points:  Review of medication regiment & side effects (2) Review of new medications or change in dosage (2)  I certify that inpatient services furnished can reasonably be expected to improve the patient's condition.   Beau FannyJohn C Withrow, FNP-BC 09/06/2013, 5:31 PM  Patient seen, evaluated and I agree with notes by Nurse Practitioner. Thedore MinsMojeed Leveda Kendrix, MD

## 2013-09-06 NOTE — [facility] Group Notes (Signed)
[facility] Group Notes:  (Nursing/MHT/Case Management/Adjunct)  Date:  09/06/2013  Time:  3:20 PM  Type of Therapy:  Psychoeducational Skills  Participation Level:  Active  Participation Quality:  Appropriate  Affect:  Appropriate  Cognitive:  Appropriate  Insight:  Appropriate  Engagement in Group:  Engaged  Modes of Intervention:  Discussion  Summary of Progress/Problems: Pt did attend healthy coping skills group.      [name] [name] [name] [name] [name] [name] 09/06/2013, 3:20 PM

## 2013-09-06 NOTE — Progress Notes (Signed)
Patient ID: [name] [name], male   DOB: [birth year], 53 y.o.   MRN: [id number] He has been up for groups interacting a little with peers and staff. Says that he has been thinking about where he can go maybe to a friends place for a few days to to the [employer] [place]. Self inventory: depression 7, hopelessness 7 denies withdrawals and has SI on and off. He contracts for safety.

## 2013-09-06 NOTE — [facility] Group Notes (Signed)
[facility] Group Notes: (Clinical Social Work)   09/06/2013      Type of Therapy:  Group Therapy   Participation Level:  Did Not Attend    [name] MantleMareida Grossman-Orr, LCSW 09/06/2013, 12:35 PM

## 2013-09-06 NOTE — Progress Notes (Signed)
Pt's status remains relatively unchanged. Still flat, sad, depressed in affect and mood. Still endorsing passive SI on and off but verbally contracts while on unit. No pain or physical problems. VSS. Medicated per orders. Ambien prn given per pt's request. Denies HI/AVH and remains safe at this time. [name]

## 2013-09-07 DIAGNOSIS — F332 Major depressive disorder, recurrent severe without psychotic features: Secondary | ICD-10-CM

## 2013-09-07 MED ORDER — NICOTINE 21 MG/24HR TD PT24: 21.0000 mg | MEDICATED_PATCH | Freq: Every day | TRANSDERMAL | Status: AC

## 2013-09-07 MED ORDER — CITALOPRAM HYDROBROMIDE 10 MG PO TABS: 30.0000 mg | ORAL_TABLET | Freq: Every day | ORAL | Status: AC

## 2013-09-07 MED ORDER — PRAZOSIN HCL 1 MG PO CAPS
3.0000 mg | ORAL_CAPSULE | Freq: Every day | ORAL | Status: DC
  Filled 2013-09-07: qty 42

## 2013-09-07 MED ORDER — PRAZOSIN HCL 1 MG PO CAPS: 3.0000 mg | ORAL_CAPSULE | Freq: Every day | ORAL | Status: AC

## 2013-09-07 MED ORDER — ZOLPIDEM TARTRATE 10 MG PO TABS: 10.0000 mg | ORAL_TABLET | Freq: Every evening | ORAL | Status: AC | PRN

## 2013-09-07 MED ORDER — DIVALPROEX SODIUM 250 MG PO DR TAB: 250.0000 mg | DELAYED_RELEASE_TABLET | Freq: Three times a day (TID) | ORAL | Status: AC

## 2013-09-07 MED ORDER — BUPROPION HCL ER (XL) 150 MG PO TB24: 150.0000 mg | ORAL_TABLET | Freq: Every day | ORAL | Status: AC

## 2013-09-07 MED ORDER — CITALOPRAM HYDROBROMIDE 10 MG PO TABS
30.0000 mg | ORAL_TABLET | Freq: Every day | ORAL | Status: DC
  Filled 2013-09-07: qty 42

## 2013-09-07 NOTE — [facility] Group Notes (Signed)
[facility]   09/07/2013 9:53 AM  Participation Quality:  Appropriate   Mood/Affect:  Appropriate  Depression Rating:  2  Anxiety Rating:  2  Thoughts of Suicide:  No Will you contract for safety?   NA  Current AVH:  No  Plan for Discharge/Comments:  Pt reports that he wants to d/c today. He feels good about d/c plan and reports that he is excited about going to therapy for first time. Pt reports that he had a good weekend.   Transportation Means: bus pass in chart.   Supports: few friends in community/no family supports.   Onie Hayashi Smart [employer]CSWA

## 2013-09-07 NOTE — Progress Notes (Signed)
Adult Psychoeducational Group Note  Date:  09/07/2013 Time:  10:00 am  Group Topic/Focus:  Wellness Toolbox:   The focus of this group is to discuss various aspects of wellness, balancing those aspects and exploring ways to increase the ability to experience wellness.  Patients will create a wellness toolbox for use upon discharge.  Participation Level:  Did Not Attend   [name] [name] 09/07/2013, 10:56 AM

## 2013-09-07 NOTE — Progress Notes (Signed)
[facility] :  Will you be returning to the same living situation after discharge: Yes,  home At discharge, do you have transportation home?:Yes,  bus pass in chart. Do you have the ability to pay for your medications:Yes,  mental health  Release of information consent forms completed and submitted to Medical Records by CSW.  Patient to Follow up at: Follow-up Information   Follow up with [facility]. (Walk in between 8am-9am Monday through Friday for hospital follow-up/medication management/assessment for therapy services. )    Contact information:   [address] [place], [place] [address] Phone: [phone] Fax: [phone]      Follow up with [facility]  On 09/10/2013. (Appointment for individual therapy at 10AM with Rudi RummageSharon Burkett. The office will call you prior to appt to verify time and date. )    Contact information:   [facility]  [address]  [place], [place] [address] Phone: [phone] Fax: [phone]        Patient denies SI/HI:   Yes,  during group/self report.     Safety Planning and Suicide Prevention discussed:  Yes,  SPE completed with pt as he refused to consent to family contact. SPI pamphlet provided to pt and he was encouraged to share information with support network, ask questions, and talk about any concerns relating to SPE.  Tashaun Obey Smart LCSWA  09/07/2013, 11:01 AM

## 2013-09-07 NOTE — [facility] Suicide Risk Assessment (Addendum)
Suicide Risk Assessment  Discharge Assessment     Demographic Factors:  Male and Caucasian  Total Time spent with patient: 45 minutes  Psychiatric Specialty Exam:     Blood pressure 113/77, pulse 81, temperature 97.6 F (36.4 C), temperature source Oral, resp. rate 20.There is no weight on file to calculate BMI.  General Appearance: Fairly Groomed  Patent attorneyye Contact::  Fair  Speech:  Clear and Coherent  Volume:  Normal  Mood:  worried  Affect:  Restricted  Thought Process:  Coherent and Goal Directed  Orientation:  Full (Time, Place, and Person)  Thought Content:  plans as he moves  Suicidal Thoughts:  No  Homicidal Thoughts:  No  Memory:  Immediate;   Fair Recent;   Fair Remote;   Fair  Judgement:  Fair  Insight:  Present  Psychomotor Activity:  Normal  Concentration:  Fair  Recall:  FiservFair  Fund of Knowledge:NA  Language: Fair  Akathisia:  No  Handed:    AIMS (if indicated):     Assets:  Desire for Improvement Housing  Sleep:  Number of Hours: 6    Musculoskeletal: Strength & Muscle Tone: within normal limits Gait & Station: normal Patient leans: N/A   Mental Status Per Nursing Assessment::   On Admission:  Suicidal ideation indicated by patient  Current Mental Status by Physician: In full contact with reality. There are no active S/S of withdrawal. No active SI plans or intent. His mood is improved affect is broader. Will be staying with a friend waiting to hear from disability   Loss Factors: Financial problems/change in socioeconomic status  Historical Factors: NA  Risk Reduction Factors:   Living with another person, especially a relative  Continued Clinical Symptoms:  Depression:   Insomnia Severe  Cognitive Features That Contribute To Risk:  Closed-mindedness Polarized thinking Thought constriction (tunnel vision)    Suicide Risk:  Minimal: No identifiable suicidal ideation.  Patients presenting with no risk factors but with morbid  ruminations; may be classified as minimal risk based on the severity of the depressive symptoms  Discharge Diagnoses:   AXIS I:  Major Depression recurrent severe, Cannabis Abuse AXIS II:  No diagnosis AXIS III:   Past Medical History  Diagnosis Date  . Paranoid schizophrenia   . Schizophrenia    AXIS IV:  other psychosocial or environmental problems AXIS V:  61-70 mild symptoms  Plan Of Care/Follow-up recommendations:  Activity:  as tolerated Diet:  regular Follow up [facility] Is patient on multiple antipsychotic therapies at discharge:  No   Has Patient had three or more failed trials of antipsychotic monotherapy by history:  No  Recommended Plan for Multiple Antipsychotic Therapies: NA    Rachael FeeIrving A Tarita Deshmukh 09/07/2013, 1:38 PM

## 2013-09-07 NOTE — Tx Team (Signed)
Interdisciplinary Treatment Plan Update (Adult)  Date: 09/07/2013   Time Reviewed: 10:58 AM  Progress in Treatment:  Attending groups: Yes  Participating in groups:  Yes  Taking medication as prescribed: Yes  Tolerating medication: Yes  Family/Significant othe contact made: Pt refused contact with family. SPE completed with pt.  Patient understands diagnosis: Yes, AEB seeking treatment for mood stabilization, med management, and SI with plan. Pt requesting to program on 500.  Discussing patient identified problems/goals with staff: Yes  Medical problems stabilized or resolved: Yes  Denies suicidal/homicidal ideation:passive SI/pt able to contract for safety on unit.  Patient has not harmed self or Others: Yes  New problem(s) identified:  Discharge Plan or Barriers: Pt plans to return to friends' home at d/c and [facility] and has appt [facility] for therapy.   Additional comments: n/a  Reason for Continuation of Hospitalization: d/c today  Estimated length of stay: d/c today For review of initial/current patient goals, please see plan of care.  Attendees:  Patient:    Family:    Physician: Geoffery LyonsIrving Lugo MD 09/07/2013 10:58 AM   Nursing: Vanessa KickJan RN  09/07/2013 10:58 AM   Clinical Social Worker Anaalicia Reimann Smart, LCSWA  09/07/2013 10:58 AM   Other: Victorino DikeJennifer RN  09/07/2013 10:58 AM   Other:  09/07/2013 10:58 AM   Other: Darden DatesJennifer C. Nurse CM 09/07/2013 10:58 AM   Other: Chandra BatchAggie N. PA 09/07/2013 10:58 AM   Scribe for Treatment Team:  The Sherwin-WilliamsHeather Smart LCSWA 09/07/2013 10:58 AM

## 2013-09-07 NOTE — Progress Notes (Signed)
Patient ID: [name] [name], male   DOB: 01/18/1961, 53 y.o.   MRN: [id number]  D: Patient has a flat affect on approach today. Gives depression and feelings of hopelessness "5" on scale. Currently denies any SI at this time. Some passive SI over the weekend but none at present.. A: Staff will continue to monitor on q 15 minute checks, follow treatment plan, and give meds as ordered. R: Cooperative on the unit. Taking meds without issue.
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
## 2013-09-07 NOTE — Progress Notes (Signed)
Patient ID: [name] [name], male   DOB: [birth year], 53 y.o.   MRN: [id number]  D: Patient reports he feels ready for discharge. Currently denies any SI and medications adjusted during hospitalization.  A: Obtained all belongings, prescriptions, follow up appointments, sample meds, and satisfaction survey given. R: Given bus pass and discharged out front door.

## 2013-09-08 NOTE — Progress Notes (Signed)
Case discussed, agree with plan 

## 2013-09-11 NOTE — Progress Notes (Signed)
Patient Discharge Instructions:  After Visit Summary (AVS):   Faxed to:  09/11/13 Discharge Summary Note:   Faxed to:  09/11/13 Psychiatric Admission Assessment Note:   Faxed to:  09/11/13 Suicide Risk Assessment - Discharge Assessment:   Faxed to:  09/11/13 Faxed/Sent to the Next Level Care provider:  09/11/13 Faxed to [facility] @ [phone] Faxed to [facility] @ [phone]  Jerelene ReddenSheena E Willacy, 09/11/2013, 1:47 PM

## 2018-06-09 ENCOUNTER — Other Ambulatory Visit: Payer: Self-pay

## 2018-06-09 ENCOUNTER — Emergency Department
Admission: EM | Admit: 2018-06-09 | Discharge: 2018-06-09 | Disposition: A | Discharge status: Home / Self Care | Payer: No Typology Code available for payment source | Attending: Emergency Medicine | Admitting: Emergency Medicine

## 2018-06-09 ENCOUNTER — Emergency Department: Payer: No Typology Code available for payment source

## 2018-06-09 ENCOUNTER — Encounter: Payer: Self-pay | Admitting: Emergency Medicine

## 2018-06-09 DIAGNOSIS — S161XXA Strain of muscle, fascia and tendon at neck level, initial encounter: Secondary | ICD-10-CM

## 2018-06-09 DIAGNOSIS — Y998 Other external cause status: Secondary | ICD-10-CM | POA: Diagnosis not present

## 2018-06-09 DIAGNOSIS — Y92410 Unspecified street and highway as the place of occurrence of the external cause: Secondary | ICD-10-CM | POA: Diagnosis not present

## 2018-06-09 DIAGNOSIS — Y9389 Activity, other specified: Secondary | ICD-10-CM | POA: Insufficient documentation

## 2018-06-09 DIAGNOSIS — S63501A Unspecified sprain of right wrist, initial encounter: Secondary | ICD-10-CM | POA: Diagnosis not present

## 2018-06-09 DIAGNOSIS — F17210 Nicotine dependence, cigarettes, uncomplicated: Secondary | ICD-10-CM | POA: Diagnosis not present

## 2018-06-09 DIAGNOSIS — S199XXA Unspecified injury of neck, initial encounter: Secondary | ICD-10-CM | POA: Diagnosis present

## 2018-06-09 DIAGNOSIS — F1210 Cannabis abuse, uncomplicated: Secondary | ICD-10-CM | POA: Diagnosis not present

## 2018-06-09 MED ORDER — BACLOFEN 10 MG PO TABS: 10.0000 mg | ORAL_TABLET | Freq: Three times a day (TID) | ORAL | 1 refills | Status: AC

## 2018-06-09 MED ORDER — MELOXICAM 15 MG PO TABS: 15.0000 mg | ORAL_TABLET | Freq: Every day | ORAL | 2 refills | Status: AC
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
## 2018-06-09 NOTE — Discharge Instructions (Addendum)
Follow-up with your regular doctor if not better in 5 to 7 days or call orthopedics.  You may need physical therapy to return to baseline.  Take the medications as prescribed.  Apply ice to all areas that hurt.  The more active you are the less or your muscles will be.  Return to emergency department if you are worsening.
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
